# Patient Record
Sex: Female | Born: 1935 | Hispanic: No | Smoking: Never smoker
Health system: Southern US, Community
[De-identification: ages and names within clinical notes are randomized; demographics above are authoritative.]

## PROBLEM LIST (undated history)

## (undated) DIAGNOSIS — I10 Essential (primary) hypertension: Secondary | ICD-10-CM

---

## 2007-10-13 ENCOUNTER — Ambulatory Visit: Payer: Self-pay | Admitting: Internal Medicine

## 2008-11-16 ENCOUNTER — Ambulatory Visit: Payer: Self-pay | Admitting: Ophthalmology

## 2008-11-29 ENCOUNTER — Ambulatory Visit: Payer: Self-pay | Admitting: Ophthalmology

## 2010-03-09 ENCOUNTER — Ambulatory Visit: Payer: Self-pay | Admitting: Internal Medicine

## 2016-01-27 ENCOUNTER — Other Ambulatory Visit: Payer: Self-pay | Admitting: Internal Medicine

## 2016-01-27 ENCOUNTER — Ambulatory Visit
Admission: RE | Admit: 2016-01-27 | Discharge: 2016-01-27 | Disposition: A | Discharge status: Home / Self Care | Payer: Medicare Other | Source: Ambulatory Visit | Attending: Internal Medicine | Admitting: Internal Medicine

## 2016-01-27 DIAGNOSIS — R609 Edema, unspecified: Secondary | ICD-10-CM | POA: Insufficient documentation

## 2016-07-27 ENCOUNTER — Emergency Department: Payer: Medicare Other

## 2016-07-27 ENCOUNTER — Encounter: Payer: Self-pay | Admitting: Emergency Medicine

## 2016-07-27 ENCOUNTER — Observation Stay
Admission: EM | Admit: 2016-07-27 | Discharge: 2016-08-01 | Disposition: A | Discharge status: Skilled Nursing Facility | Payer: Medicare Other | Attending: Internal Medicine | Admitting: Internal Medicine

## 2016-07-27 DIAGNOSIS — R45851 Suicidal ideations: Secondary | ICD-10-CM

## 2016-07-27 DIAGNOSIS — R778 Other specified abnormalities of plasma proteins: Secondary | ICD-10-CM | POA: Diagnosis not present

## 2016-07-27 DIAGNOSIS — F03B18 Unspecified dementia, moderate, with other behavioral disturbance: Secondary | ICD-10-CM

## 2016-07-27 DIAGNOSIS — R531 Weakness: Secondary | ICD-10-CM | POA: Diagnosis not present

## 2016-07-27 DIAGNOSIS — F0391 Unspecified dementia with behavioral disturbance: Principal | ICD-10-CM | POA: Insufficient documentation

## 2016-07-27 DIAGNOSIS — N179 Acute kidney failure, unspecified: Secondary | ICD-10-CM | POA: Insufficient documentation

## 2016-07-27 DIAGNOSIS — D649 Anemia, unspecified: Secondary | ICD-10-CM | POA: Insufficient documentation

## 2016-07-27 DIAGNOSIS — R739 Hyperglycemia, unspecified: Secondary | ICD-10-CM | POA: Insufficient documentation

## 2016-07-27 DIAGNOSIS — I248 Other forms of acute ischemic heart disease: Secondary | ICD-10-CM | POA: Insufficient documentation

## 2016-07-27 DIAGNOSIS — R4182 Altered mental status, unspecified: Secondary | ICD-10-CM | POA: Diagnosis present

## 2016-07-27 DIAGNOSIS — I119 Hypertensive heart disease without heart failure: Secondary | ICD-10-CM | POA: Insufficient documentation

## 2016-07-27 DIAGNOSIS — R2681 Unsteadiness on feet: Secondary | ICD-10-CM

## 2016-07-27 HISTORY — DX: Essential (primary) hypertension: I10

## 2016-07-27 LAB — CBC
HEMATOCRIT: 39.7 % (ref 35.0–47.0)
Hemoglobin: 13 g/dL (ref 12.0–16.0)
MCH: 33.2 pg (ref 26.0–34.0)
MCHC: 32.8 g/dL (ref 32.0–36.0)
MCV: 101.1 fL — ABNORMAL HIGH (ref 80.0–100.0)
Platelets: 215 10*3/uL (ref 150–440)
RBC: 3.92 MIL/uL (ref 3.80–5.20)
RDW: 12.9 % (ref 11.5–14.5)
WBC: 8.7 10*3/uL (ref 3.6–11.0)

## 2016-07-27 LAB — COMPREHENSIVE METABOLIC PANEL
ALBUMIN: 3.7 g/dL (ref 3.5–5.0)
ALK PHOS: 86 U/L (ref 38–126)
ALT: 18 U/L (ref 14–54)
AST: 34 U/L (ref 15–41)
Anion gap: 9 (ref 5–15)
BUN: 19 mg/dL (ref 6–20)
CALCIUM: 9.1 mg/dL (ref 8.9–10.3)
CO2: 27 mmol/L (ref 22–32)
CREATININE: 1.19 mg/dL — AB (ref 0.44–1.00)
Chloride: 107 mmol/L (ref 101–111)
GFR calc Af Amer: 44 mL/min — ABNORMAL LOW (ref >60)
GFR calc non Af Amer: 38 mL/min — ABNORMAL LOW (ref >60)
GLUCOSE: 130 mg/dL — AB (ref 65–99)
Potassium: 4.1 mmol/L (ref 3.5–5.1)
SODIUM: 143 mmol/L (ref 135–145)
Total Bilirubin: 1 mg/dL (ref 0.3–1.2)
Total Protein: 7.8 g/dL (ref 6.5–8.1)

## 2016-07-27 LAB — SALICYLATE LEVEL

## 2016-07-27 LAB — ACETAMINOPHEN LEVEL: Acetaminophen (Tylenol), Serum: <10 ug/mL — ABNORMAL LOW (ref 10–30)

## 2016-07-27 LAB — TROPONIN I: TROPONIN I: 0.11 ng/mL — AB (ref <0.03)

## 2016-07-27 LAB — ETHANOL

## 2016-07-27 MED ORDER — SODIUM CHLORIDE 0.9 % IV SOLN
INTRAVENOUS | Status: DC
  Administered 2016-07-28: via INTRAVENOUS

## 2016-07-27 MED ORDER — ACETAMINOPHEN 650 MG RE SUPP: 650.0000 mg | Freq: Four times a day (QID) | RECTAL | Status: DC | PRN

## 2016-07-27 MED ORDER — ONDANSETRON HCL 4 MG PO TABS: 4.0000 mg | ORAL_TABLET | Freq: Four times a day (QID) | ORAL | Status: DC | PRN

## 2016-07-27 MED ORDER — ACETAMINOPHEN 325 MG PO TABS: 650.0000 mg | ORAL_TABLET | Freq: Four times a day (QID) | ORAL | Status: DC | PRN

## 2016-07-27 MED ORDER — ASPIRIN EC 81 MG PO TBEC
81.0000 mg | DELAYED_RELEASE_TABLET | Freq: Every day | ORAL | Status: DC
  Administered 2016-07-28 – 2016-08-01 (×4): 81 mg via ORAL
  Filled 2016-07-27 (×4): qty 1

## 2016-07-27 MED ORDER — BISACODYL 5 MG PO TBEC: 5.0000 mg | DELAYED_RELEASE_TABLET | Freq: Every day | ORAL | Status: DC | PRN

## 2016-07-27 MED ORDER — ASPIRIN 81 MG PO CHEW
324.0000 mg | CHEWABLE_TABLET | Freq: Once | ORAL | Status: AC
  Administered 2016-07-27: 324 mg via ORAL
  Filled 2016-07-27: qty 4

## 2016-07-27 MED ORDER — OXYCODONE HCL 5 MG PO TABS: 5.0000 mg | ORAL_TABLET | ORAL | Status: DC | PRN

## 2016-07-27 MED ORDER — ONDANSETRON HCL 4 MG/2ML IJ SOLN: 4.0000 mg | Freq: Four times a day (QID) | INTRAMUSCULAR | Status: DC | PRN

## 2016-07-27 MED ORDER — SENNOSIDES-DOCUSATE SODIUM 8.6-50 MG PO TABS: 1.0000 | ORAL_TABLET | Freq: Every evening | ORAL | Status: DC | PRN

## 2016-07-27 MED ORDER — ENOXAPARIN SODIUM 40 MG/0.4ML ~~LOC~~ SOLN
40.0000 mg | SUBCUTANEOUS | Status: DC
  Administered 2016-07-28: 40 mg via SUBCUTANEOUS
  Filled 2016-07-27: qty 0.4

## 2016-07-27 MED ORDER — SODIUM CHLORIDE 0.9% FLUSH
3.0000 mL | Freq: Two times a day (BID) | INTRAVENOUS | Status: DC
  Administered 2016-07-28 – 2016-07-31 (×4): 3 mL via INTRAVENOUS

## 2016-07-27 MED ORDER — MAGNESIUM CITRATE PO SOLN: 1.0000 | Freq: Once | ORAL | Status: DC | PRN

## 2016-07-27 NOTE — ED Notes (Signed)
Pt continues to deny pain. Pt repeatedly asking, "where is my car, where is my purse,why am I in jail". Multiple attempts made to reassure pt.

## 2016-07-27 NOTE — ED Notes (Signed)
CBC collected, R Forearm, sent to lab.

## 2016-07-27 NOTE — ED Notes (Signed)
Jan, ed tech at bedside for sitting and ivc.

## 2016-07-27 NOTE — BH Assessment (Signed)
Assessment Note  Allison Robinson is an 83 y.o. female, with undiagnosed dementia, presEmelda Brothersenting to the ED after being brought in by her family.  Per patient's  family she has been becoming more forgetful over the past several months. However, when the daughter visited earlier this week she found that the patient had wandered outside in the snow. She was threatening suicide to shoot herself with a gun. Patient has no memory of trying to shoot herself. The daughter then remove the gun from the house. The daughter also reports that the patient has been talking to people who were not there.  Patient is not oriented to place, time and situation.  She believes that she is in jail and being punished.  She has no memory of her daughter bringing her to the hospital or even talking with her daughter.  She believes that someone in the hospital has taken her pocketbook even being repeatedly told that her family has her belongings.    Diagnosis: Undiagnosed dementia  Past Medical History:  Past Medical History:  Diagnosis Date  . Hypertension     History reviewed. No pertinent surgical history.  Family History: No family history on file.  Social History:  reports that she has never smoked. Her smokeless tobacco use includes Snuff. She reports that she does not drink alcohol. Her drug history is not on file.  Additional Social History:  Alcohol / Drug Use Pain Medications: See PTA Prescriptions: See PTA Over the Counter: See PTA History of alcohol / drug use?: No history of alcohol / drug abuse  CIWA: CIWA-Ar BP: (!) 175/70 Pulse Rate: 94 COWS:    Allergies:  Allergies  Allergen Reactions  . Dairy Aid [Lactase]     Intolerance.    Home Medications:  (Not in a hospital admission)  OB/GYN Status:  No LMP recorded. Patient is postmenopausal.  General Assessment Data [facility] Assessment: In system Is this a Tele or Face-to-Face Assessment?: Face-to-Face Is this an  Initial Assessment or a Re-assessment for this encounter?: Initial Assessment Marital status: Single Maiden name: na Is patient pregnant?: No Pregnancy Status: No Living Arrangements: Children Can pt return to current living arrangement?: No (Pt's family is unable to care for her.) Admission Status: Involuntary Is patient capable of signing voluntary admission?: No Referral Source: Self/Family/Friend Insurance type: Medicare  Medical Screening Exam [facility]) Medical Exam completed: Yes  Crisis Care Plan Living Arrangements: Children Legal Guardian: Other: (self) Name of Psychiatrist: none reported Name of Therapist: none reported  Education Status Is patient currently in school?: No Current Grade: na Highest grade of school patient has completed: na Name of school: na Contact person: na  Risk to self with the past 6 months Suicidal Ideation: No Has patient been a risk to self within the past 6 months prior to admission? : No Suicidal Intent: No Has patient had any suicidal intent within the past 6 months prior to admission? : Yes Is patient at risk for suicide?: No Suicidal Plan?: No Has patient had any suicidal plan within the past 6 months prior to admission? : No Access to Means: No What has been your use of drugs/alcohol within the last 12 months?: None reported Previous Attempts/Gestures: No How many times?: 0 Other Self Harm Risks: altered mental status Triggers for Past Attempts: None known Intentional Self Injurious Behavior: None Family Suicide History: No Recent stressful life event(s): Other (Comment) Persecutory voices/beliefs?: No Depression: No Substance abuse history and/or treatment for  substance abuse?: No Suicide prevention information given to non-admitted patients: Not applicable  Risk to Others within the past 6 months Homicidal Ideation: No Does patient have any lifetime risk of violence toward others beyond the six months prior to  admission? : No Thoughts of Harm to Others: No Current Homicidal Intent: No Current Homicidal Plan: No Access to Homicidal Means: No Identified Victim: none identified History of harm to others?: No Assessment of Violence: None Noted Violent Behavior Description: None identified Does patient have access to weapons?: No Criminal Charges Pending?: No Does patient have a court date: No Is patient on probation?: No  Psychosis Hallucinations: None noted Delusions: Unspecified  Mental Status Report Appearance/Hygiene: Unremarkable Eye Contact: Good Motor Activity: Freedom of movement Speech: Argumentative Level of Consciousness: Alert Mood: Suspicious, Irritable Affect: Inconsistent with thought content Anxiety Level: None Thought Processes: Flight of Ideas Judgement: Partial Orientation: Person, Place Obsessive Compulsive Thoughts/Behaviors: None  Cognitive Functioning Concentration: Fair Memory: Remote Intact IQ: Average Insight: Unable to Assess Impulse Control: Unable to Assess Appetite: Fair Weight Loss: 0 Weight Gain: 0 Sleep: Unable to Assess Vegetative Symptoms: None  ADLScreening [facility] Assessment Services) Patient's cognitive ability adequate to safely complete daily activities?: Yes Patient able to express need for assistance with ADLs?: Yes Independently performs ADLs?: Yes (appropriate for developmental age)  Prior Inpatient Therapy Prior Inpatient Therapy: No Prior Therapy Dates: na Prior Therapy Facilty/Provider(s): na Reason for Treatment: na  Prior Outpatient Therapy Prior Outpatient Therapy: No Prior Therapy Dates: na Prior Therapy Facilty/Provider(s): na Reason for Treatment: na Does patient have an ACCT team?: No Does patient have Intensive In-House Services?  : No Does patient have [facility] services? : No Does patient have P4CC services?: No  ADL Screening (condition at time of admission) Patient's cognitive ability adequate to safely  complete daily activities?: Yes Patient able to express need for assistance with ADLs?: Yes Independently performs ADLs?: Yes (appropriate for developmental age)       Abuse/Neglect Assessment (Assessment to be complete while patient is alone) Physical Abuse: Denies Verbal Abuse: Denies Sexual Abuse: Denies Exploitation of patient/patient's resources: Denies Self-Neglect: Denies Values / Beliefs Cultural Requests During Hospitalization: None Spiritual Requests During Hospitalization: None Consults Spiritual Care Consult Needed: No Social Work Consult Needed: Yes (Comment) (Patient's family is requesting long term placement) Merchant navy officer (For Healthcare) Does Patient Have a Medical Advance Directive?: Yes Type of Advance Directive: Environmental manager of Healthcare Power of Attorney in Chart?: No - copy requested    Additional Information 1:1 In Past 12 Months?: No CIRT Risk: No Elopement Risk: No Does patient have medical clearance?: No     Disposition:  Disposition Initial Assessment Completed for this Encounter: Yes Disposition of Patient: Other dispositions Other disposition(s): Other (Comment) (Patient is being admitted to the medical floor.)  On Site Evaluation by:   Reviewed with Physician:    [place] 07/27/2016 10:12 PM

## 2016-07-27 NOTE — ED Notes (Signed)
Critical troponin of 0.11 called from lab. Pt to move back into room. Pt placed on cardiac monitor. Dr. Pershing Proudschaevitz notified of troponin of 0.11.

## 2016-07-27 NOTE — ED Notes (Signed)
Family states they would like pt admitted to hospital so they can seek placement in a nursing facility. Pt has dry lips, po fluids provided. Pt has dry oral mucus membranes. Pt is alert to self only. Pt placed in yellow socks, yellow arm band placed on pt. Pt placed in hospital gown and warm blankets provided.

## 2016-07-27 NOTE — ED Notes (Signed)
Pt refusing to remove beaded brown bracelet, bra or slip and shoes. Pt to ct head.

## 2016-07-27 NOTE — ED Notes (Addendum)
Pt's family states "she is getting real agitated, she need something." md has not assessed pt at this time. Family updated that md is requesting to see pt first. Family does not seem to understand ed treatment process regardless of repeated attempts by this rn to speak with family regarding same. Pt is sitting in bed with covers on and smiling at this rn, pt does not appear agiated at this time.

## 2016-07-27 NOTE — H&P (Signed)
History and Physical   [facility] [place] @ [facility] Admission History and Physical [employer], D.O.    Patient Name: Allison Robinson MR#: [id number] Date of Birth: [birth year] Date of Admission: 07/27/2016  Referring MD/NP/PA: Dr. Pershing Proud Primary Care Physician: No PCP Per Patient  Patient coming from: Home, lives independently  Chief Complaint: Altered mental status These note the entire history is obtained from the emergency department physician of the patient's chart as the patient had history is limited by dementia.   HPI: Allison Robinson is a 83 y.o. female with a known history of HTN presents to the emergency department for evaluation of AMS.  Patient reportedly lives independently however the patient's family brought her to the emergency department today for concern of altered mental status. Family reported to the emergency department staff that she has becoming more forgetful and confused over the past several months however recently there was an episode where the family member came to visit and found her walking outside in the snow, hallucinating. Today she was apparently threatening to shoot herself with a gun that was indeed in her home. Family members remove the gun from the house and brought her to the emergency department..   On questioning, the patient denies all complaints. She states that she feels fine. She believes she is being held in jail against her will.  Patient denies fevers/chills, weakness, dizziness, chest pain, shortness of breath, N/V/C/D, abdominal pain, dysuria/frequency, suicidal ideation/homicidal ideation.  Review of Systems:  Unable to obtain secondary to dementia, altered mental status   Past Medical History:  Diagnosis Date  . Hypertension     History reviewed. No pertinent surgical history.   reports that she has never smoked. Her smokeless tobacco use includes Snuff. She reports that she does not drink alcohol. Her drug history is  not on file.  Allergies  Allergen Reactions  . Dairy Aid [Lactase]     Intolerance.    No family history on file. Family history has been reviewed and confirmed with patient.   Prior to Admission medications   Not on File    Physical Exam: Vitals:   07/27/16 1936 07/27/16 2145 07/27/16 2200 07/27/16 2215  BP: (!) 160/66  (!) 175/70   Pulse: 90 94  (!) 101  Resp: 18 18 (!) 23 16  Temp:      TempSrc:      SpO2: 96% 99%  100%    GENERAL: 83 y.o.-year-old Black female patient, well-developed, well-nourished lying in the bed in no acute distress.  Pleasant and cooperative.  Pleasantly confused HEENT: Head atraumatic, normocephalic. Pupils equal, round, reactive to light and accommodation. No scleral icterus. Extraocular muscles intact. Nares are patent. Oropharynx is clear. Mucus membranes dry. NECK: Supple, full range of motion. No JVD, no bruit heard. No thyroid enlargement, no tenderness, no cervical lymphadenopathy. CHEST: Normal breath sounds bilaterally. No wheezing, rales, rhonchi or crackles. No use of accessory muscles of respiration.  No reproducible chest wall tenderness.  CARDIOVASCULAR: S1, S2 normal. No murmurs, rubs, or gallops. Cap refill <2 seconds. Pulses intact distally.  ABDOMEN: Soft, nondistended, nontender, . No rebound, guarding, rigidity. Normoactive bowel sounds present in all four quadrants. No organomegaly or mass. EXTREMITIES: No pedal edema, cyanosis, or clubbing. NEUROLOGIC: Cranial nerves II through XII are grossly intact with no focal sensorimotor deficit. Muscle strength 5/5 in all extremities. Sensation intact. Gait not checked. PSYCHIATRIC: The patient is alert and oriented x 1, person only. SKIN: Warm, dry, and intact without obvious  rash, lesion, or ulcer.   Labs on Admission: I have personally reviewed following labs and imaging studies  CBC:  Recent Labs Lab 07/27/16 1526  WBC 8.7  HGB 13.0  HCT 39.7  MCV 101.1*  PLT 215   Basic  Metabolic Panel:  Recent Labs Lab 07/27/16 1526  NA 143  K 4.1  CL 107  CO2 27  GLUCOSE 130*  BUN 19  CREATININE 1.19*  CALCIUM 9.1   GFR: CrCl cannot be calculated (Unknown ideal weight.). Liver Function Tests:  Recent Labs Lab 07/27/16 1526  AST 34  ALT 18  ALKPHOS 86  BILITOT 1.0  PROT 7.8  ALBUMIN 3.7   No results for input(s): LIPASE, AMYLASE in the last 168 hours. No results for input(s): AMMONIA in the last 168 hours. Coagulation Profile: No results for input(s): INR, PROTIME in the last 168 hours. Cardiac Enzymes:  Recent Labs Lab 07/27/16 1526  TROPONINI 0.11*   BNP (last 3 results) No results for input(s): PROBNP in the last 8760 hours. HbA1C: No results for input(s): HGBA1C in the last 72 hours. CBG: No results for input(s): GLUCAP in the last 168 hours. Lipid Profile: No results for input(s): CHOL, HDL, LDLCALC, TRIG, CHOLHDL, LDLDIRECT in the last 72 hours. Thyroid Function Tests: No results for input(s): TSH, T4TOTAL, FREET4, T3FREE, THYROIDAB in the last 72 hours. Anemia Panel: No results for input(s): VITAMINB12, FOLATE, FERRITIN, TIBC, IRON, RETICCTPCT in the last 72 hours. Urine analysis: No results found for: COLORURINE, APPEARANCEUR, LABSPEC, PHURINE, GLUCOSEU, HGBUR, BILIRUBINUR, KETONESUR, PROTEINUR, UROBILINOGEN, NITRITE, LEUKOCYTESUR Sepsis Labs: @LABRCNTIP (procalcitonin:4,lacticidven:4) )No results found for this or any previous visit (from the past 240 hour(s)).   Radiological Exams on Admission: Dg Chest 1 View  Result Date: 07/27/2016 CLINICAL DATA:  Altered mental status EXAM: CHEST 1 VIEW COMPARISON:  None. FINDINGS: Cardiomegaly with aortic atherosclerosis. Granuloma projects over the left posterior fifth rib. Lungs are free of pneumonic consolidations. No effusion or pneumothorax. There is no suspicious osseous abnormality. IMPRESSION: Cardiomegaly with aortic atherosclerosis. Left upper lobe granuloma. No acute pulmonary  disease. Electronically Signed   By: Tollie Eth M.D.   On: 07/27/2016 21:15   Ct Head Wo Contrast  Result Date: 07/27/2016 CLINICAL DATA:  Initial valuation for hallucination. EXAM: CT HEAD WITHOUT CONTRAST TECHNIQUE: Contiguous axial images were obtained from the base of the skull through the vertex without intravenous contrast. COMPARISON:  None available. FINDINGS: Brain: Generalized age-related cerebral atrophy with mild chronic small vessel ischemic disease. No acute intracranial hemorrhage. No evidence for acute large vessel territory infarct. No mass lesion, midline shift or mass effect. No hydrocephalus. No extra-axial fluid collection. Vascular: No hyperdense vessel. Scattered intracranial atherosclerosis noted. Skull: Scalp soft tissues demonstrate no acute abnormality. Hyperostosis frontalis interna. Calvarium intact. Sinuses/Orbits: Visualized globes and orbits within normal limits. Visualized paranasal sinuses and mastoids are clear. IMPRESSION: 1. No acute intracranial process. 2. Generalized age-related cerebral atrophy with mild chronic small vessel ischemic disease. Electronically Signed   By: Rise Mu M.D.   On: 07/27/2016 21:35    EKG: Normal sinus rhythm at 94 bpm with leftward axis, right bundle branch block and T-wave inversion in V2 and nonspecific ST-T wave changes.   Assessment/Plan Active Problems:   Altered mental status    This is a 83 y.o. female with a history of hypertension, undiagnosed dementia now being admitted with:  1. Altered mental status/suicidal ideation. Patient is a symptomatic and workup in the emergency department thus far has been unremarkable. This is  likely secondary to worsening of her dementia however patient is unsafe to live independently at present especially since she has been found hallucinating and reporting suicidal ideation. In the emergency department she was placed under involuntary commitment Therefore we will admit her to  inpatient for monitoring, psychiatric evaluation, physical therapy evaluation and social work consultation for consideration of placement as per family's wishes.  2. Elevated troponin, likely secondary to demand ischemia. Patient's EKG is nonischemic and she does not compare chest pain. We'll trend troponins, check lipids, TSH. Will give aspirin and start low-dose beta blocker for hypertension   Admission status: Inpatient, telemetry, involuntary commitment IV Fluids: Hep-Lock Diet/Nutrition: Heart healthy Consults called: PT and social work  DVT Px: Lovenox, SCDs and early ambulation Code Status: Full Code  Disposition Plan: To skilled nursing facility in 3-4 days   All the records are reviewed and case discussed with ED provider. Management plans discussed with the patient and/or family who express understanding and agree with plan of care.  Kynesha Guerin D.O. on 07/27/2016 at 10:50 PM Between 7am to 6pm - Pager - [phone] After 6pm go to [email or web address] - [email or web address] [email or web address] [facility] [place] [facility] Office [phone] CC: Primary care physician; No PCP Per Patient  Tonye RoyaltyAlexis Mathew Storck MD [facility] Pager [phone]   If 7PM-7AM, please contact night-coverage [email or web address] Password [email or web address]  07/27/2016, 10:50 PM

## 2016-07-27 NOTE — ED Notes (Signed)
md in to speak with family and pt.

## 2016-07-27 NOTE — ED Provider Notes (Signed)
[facility] Emergency Department Provider Note  ____________________________________________   First MD Initiated Contact with Patient 07/27/16 1948     (approximate)  I have reviewed the triage vital signs and the nursing notes.   HISTORY  Chief Complaint Altered Mental Status   HPI Allison Robinson is a 83 y.o. female with a history of hypertension and likely undiagnosed dementia who is presenting to the emergency department after being brought in by her family. She is accompanied by her daughter as well as granddaughter. Per the family she has been becoming more forgetful over the past several months. However, when the daughter visited earlier this week she found that the patient had wandered outside in the snow. She was threatening suicide to shoot herself with a gun. The daughter then remove the gun from the house. The daughter also reports that the patient has been talking to people who were not there.  Per the patient. She has no complaints and feels fine. However, she thinks it is the year [birth year].  Past Medical History:  Diagnosis Date  . Hypertension     There are no active problems to display for this patient.   History reviewed. No pertinent surgical history.  Prior to Admission medications   Not on File    Allergies Dairy aid [lactase]  No family history on file.  Social History Social History  Substance Use Topics  . Smoking status: Never Smoker  . Smokeless tobacco: Current User    Types: Snuff  . Alcohol use No    Review of Systems Constitutional: No fever/chills Eyes: No visual changes. ENT: No sore throat. Cardiovascular: Denies chest pain. Respiratory: Denies shortness of breath. Gastrointestinal: No abdominal pain.  No nausea, no vomiting.  No diarrhea.  No constipation. Genitourinary: Negative for dysuria. Musculoskeletal: Negative for back pain. Skin: Negative for rash. Neurological: Negative for headaches, focal  weakness or numbness.  10-point ROS otherwise negative.  ____________________________________________   PHYSICAL EXAM:  VITAL SIGNS: ED Triage Vitals [07/27/16 1515]  Enc Vitals Group     BP (!) 178/55     Pulse Rate 96     Resp 16     Temp 97.4 F (36.3 C)     Temp Source Oral     SpO2 99 %     Weight      Height      Head Circumference      Peak Flow      Pain Score      Pain Loc      Pain Edu?      Excl. in [place]?     Constitutional: Alert and To place, self and birthdate but not the year. Well appearing and in no acute distress. Eyes: Conjunctivae are normal. PERRL. EOMI. Head: Atraumatic. Nose: No congestion/rhinnorhea. Mouth/Throat: Mucous membranes are moist.   Neck: No stridor.   Cardiovascular: Normal rate, regular rhythm. Grossly normal heart sounds.  Respiratory: Normal respiratory effort.  No retractions. Lungs CTAB. Gastrointestinal: Soft and nontender. No distention.  Musculoskeletal: No lower extremity tenderness nor edema.   Neurologic:  Normal speech and language. No gross focal neurologic deficits are appreciated.  Skin:  Skin is warm, dry and intact. No rash noted. Psychiatric: Mood and affect are normal. Speech and behavior are normal.  ____________________________________________   LABS (all labs ordered are listed, but only abnormal results are displayed)  Labs Reviewed  COMPREHENSIVE METABOLIC PANEL - Abnormal; Notable for the following:       Result  Value   Glucose, Bld 130 (*)    Creatinine, Ser 1.19 (*)    GFR calc non Af Amer 38 (*)    GFR calc Af Amer 44 (*)    All other components within normal limits  CBC - Abnormal; Notable for the following:    MCV 101.1 (*)    All other components within normal limits  ACETAMINOPHEN LEVEL - Abnormal; Notable for the following:    Acetaminophen (Tylenol), Serum <10 (*)    All other components within normal limits  TROPONIN I - Abnormal; Notable for the following:    Troponin I 0.11 (*)     All other components within normal limits  SALICYLATE LEVEL  ETHANOL  URINALYSIS, COMPLETE (UACMP) WITH MICROSCOPIC  URINE DRUG SCREEN, QUALITATIVE ([facility] ONLY)   ____________________________________________  EKG  ED ECG REPORT I, Doran Stabler, the attending physician, personally viewed and interpreted this ECG.   Date: 07/27/2016  EKG Time: 2129  Rate: 94  Rhythm: normal sinus rhythm  Axis: Left axis deviation  Intervals: Right bundle branch block  ST&T Change: No ST segment elevation or depression. T-wave inversion in V2.  ____________________________________________  RADIOLOGY    Comprehensive metabolic panel (Final result)  Abnormal  Component (Lab Inquiry)  Collection Time Result Time NA K CL CO2 GLUCOSE  07/27/16 15:26:00 07/27/16 16:25:42 143 4.1 107 27 130 (H)    Collection Time Result Time BUN Creatinine, Ser CALCIUM PROTEIN Albumin  07/27/16 15:26:00 07/27/16 16:25:42 19 1.19 (H) 9.1 7.8 3.7    Collection Time Result Time AST ALT ALK PHOS BILI TOTL GFR calc non Af Amer  07/27/16 15:26:00 07/27/16 16:25:42 34 18 86 1.0 38 (L)    Collection Time Result Time GFR calc Af Amer Anion gap  07/27/16 15:26:00 07/27/16 16:25:42 (NOTE)  The eGFR has been calculated using the CKD EPI equation.  This calculation has not been validated in all clinical situations.  eGFR's persistently &lte;60 mL/min signify possible Chronic Kidney  Disease.   " data-bubble="IP_HOVER_BUBBLE_SERVICE">44 (L) (NOTE)  The eGFR has b... 9       CBC (Final result)  Abnormal  Component (Lab Inquiry)  Collection Time Result Time WBC RBC HGB HCT MCV  07/27/16 15:26:00 07/27/16 16:12:39 8.7 3.92 13.0 39.7 101.1 (H)    Collection Time Result Time MCH MCHC RDW PLT  07/27/16 15:26:00 07/27/16 16:12:39 33.2 32.8 12.9 215      Acetaminophen level (Final result)  Abnormal  Component (Lab Inquiry)  Collection Time Result Time Acetaminophen (Tylenol), Serum  07/27/16  15:26:00 07/27/16 21:09:44   THERAPEUTIC CONCENTRATIONS VARY  SIGNIFICANTLY. A RANGE OF 10-30  ug/mL MAY BE AN EFFECTIVE  CONCENTRATION FOR MANY PATIENTS.  HOWEVER, SOME ARE BEST TREATED  AT CONCENTRATIONS OUTSIDE THIS  RANGE.  ACETAMINOPHEN CONCENTRATIONS  &gte;150 ug/mL AT 4 HOURS AFTER  INGESTION AND &gte;50 ug/mL AT 12  HOURS AFTER INGESTION ARE  OFTEN ASSOCIATED WITH TOXIC  REACTIONS.   ' data-bubble="IP_HOVER_BUBBLE_SERVICE"><10 (L)  THERAPEUTIC CO...       Salicylate level (Final result)  Component (Lab Inquiry)  Collection Time Result Time Salicylate Lvl  82/70/78 15:26:00 07/27/16 21:09:44 <7.0      Ethanol (Final result)  Component (Lab Inquiry)  Collection Time Result Time Alcohol, Ethyl (B)  07/27/16 15:26:00 07/27/16 21:09:44   LOWEST DETECTABLE LIMIT FOR  SERUM ALCOHOL IS 5 mg/dL  FOR MEDICAL PURPOSES ONLY   ' data-bubble="IP_HOVER_BUBBLE_SERVICE"><5  LOWEST DETECTA...       Troponin I (Final result)  Abnormal  Component (Lab Inquiry)  Collection Time Result Time TROPONIN I  07/27/16 15:26:00 07/27/16 21:18:11 CRITICAL RESULT CALLED TO, READ BACK BY AND VERIFIED WITH  APRIL BRUMGARD 07/27/16 @ 2120 [place]   ' data-bubble="IP_HOVER_BUBBLE_SERVICE">0.11 ([place]) CRITICAL RESULT CALLED.Marland KitchenMarland Kitchen     Imaging Results     CT Head Wo Contrast (In process)     DG Chest 1 View (Final result)  Result time 07/27/16 21:15:31  Final result by Massie Kluver, MD (07/27/16 21:15:31)           Narrative:   CLINICAL DATA: Altered mental status  EXAM: CHEST 1 VIEW  COMPARISON: None.  FINDINGS: Cardiomegaly with aortic atherosclerosis. Granuloma projects over the left posterior fifth rib. Lungs are free of pneumonic consolidations. No effusion or pneumothorax. There is no suspicious osseous abnormality.  IMPRESSION: Cardiomegaly with aortic atherosclerosis. Left upper lobe granuloma. No acute pulmonary disease.   Electronically Signed By:  Ashley Royalty M.D. On: 07/27/2016 21:15            ____________________________________________   PROCEDURES  Procedure(s) performed:   Procedures  Critical Care performed:   ____________________________________________   INITIAL IMPRESSION / ASSESSMENT AND PLAN / ED COURSE  Pertinent labs & imaging results that were available during my care of the patient were reviewed by me and considered in my medical decision making (see chart for details).   Clinical Course   ----------------------------------------- 9:35 PM on 07/27/2016 -----------------------------------------  Patient placed under involuntary commitment. Also found to have elevated troponin of 0.11 of unknown significance. Will be admitted medically to the hospital. At the advice of the intake specialist the tele-psychiatry consult to change to a face-to-face consult. Family updated about the need for medical admission.   ____________________________________________   FINAL CLINICAL IMPRESSION(S) / ED DIAGNOSES  Altered mental status. Suicidal ideation.    NEW MEDICATIONS STARTED DURING THIS VISIT:  New Prescriptions   No medications on file     Note:  This document was prepared using Dragon voice recognition software and may include unintentional dictation errors.    Orbie Pyo, MD 07/27/16 2136

## 2016-07-27 NOTE — ED Notes (Signed)
Bri, cna is now sitting with pt.

## 2016-07-27 NOTE — ED Notes (Addendum)
Beth, ed tech at bedside to sit with pt at this time. Pt sipping on po fluids.

## 2016-07-27 NOTE — ED Triage Notes (Signed)
Patient to ED via POV with her dtr and grand-dtr, family states that patient has been hallucinating and has been wondering outside of her house. Grand daughter states that patient needs a higher level of care than they are able to provide for patient. Family wants patient admitted for placement. Family has concerns of dehydration and UTI, family states that patients urine has been very dark.

## 2016-07-27 NOTE — ED Notes (Addendum)
Pt has brown cane, yellow metal watch and glasses. Pt previously up to commode to obtain urine sample without success, pt urinated around toilet and not in hat.

## 2016-07-27 NOTE — ED Notes (Signed)
Pt to radiology.

## 2016-07-27 NOTE — BHH Counselor (Signed)
Per Dr. Pershing ProudSchaevitz, patient will be admitted to the hospital for medical admission.

## 2016-07-28 LAB — CBC
HCT: 33.3 % — ABNORMAL LOW (ref 35.0–47.0)
Hemoglobin: 11.3 g/dL — ABNORMAL LOW (ref 12.0–16.0)
MCH: 33.5 pg (ref 26.0–34.0)
MCHC: 33.9 g/dL (ref 32.0–36.0)
MCV: 99.1 fL (ref 80.0–100.0)
PLATELETS: 179 10*3/uL (ref 150–440)
RBC: 3.36 MIL/uL — AB (ref 3.80–5.20)
RDW: 12.9 % (ref 11.5–14.5)
WBC: 7 10*3/uL (ref 3.6–11.0)

## 2016-07-28 LAB — BASIC METABOLIC PANEL
Anion gap: 5 (ref 5–15)
BUN: 12 mg/dL (ref 6–20)
CALCIUM: 8.7 mg/dL — AB (ref 8.9–10.3)
CHLORIDE: 111 mmol/L (ref 101–111)
CO2: 26 mmol/L (ref 22–32)
CREATININE: 0.77 mg/dL (ref 0.44–1.00)
GFR calc non Af Amer: >60 mL/min (ref >60)
GLUCOSE: 89 mg/dL (ref 65–99)
Potassium: 3.5 mmol/L (ref 3.5–5.1)
Sodium: 142 mmol/L (ref 135–145)

## 2016-07-28 LAB — LIPID PANEL
CHOL/HDL RATIO: 2.4 ratio
CHOLESTEROL: 122 mg/dL (ref 0–200)
HDL: 50 mg/dL (ref >40)
LDL CALC: 61 mg/dL (ref 0–99)
TRIGLYCERIDES: 53 mg/dL (ref <150)
VLDL: 11 mg/dL (ref 0–40)

## 2016-07-28 LAB — TROPONIN I
TROPONIN I: 0.14 ng/mL — AB (ref <0.03)
Troponin I: 0.13 ng/mL (ref <0.03)
Troponin I: 0.1 ng/mL (ref <0.03)

## 2016-07-28 LAB — TSH: TSH: 2.405 u[IU]/mL (ref 0.350–4.500)

## 2016-07-28 MED ORDER — ENOXAPARIN SODIUM 30 MG/0.3ML ~~LOC~~ SOLN: 30.0000 mg | Freq: Every day | SUBCUTANEOUS | Status: DC

## 2016-07-28 MED ORDER — HALOPERIDOL LACTATE 5 MG/ML IJ SOLN
INTRAMUSCULAR | Status: AC
  Administered 2016-07-28: 1 mg via INTRAVENOUS
  Filled 2016-07-28: qty 1

## 2016-07-28 MED ORDER — ENOXAPARIN SODIUM 40 MG/0.4ML ~~LOC~~ SOLN
40.0000 mg | Freq: Every day | SUBCUTANEOUS | Status: DC
  Administered 2016-07-28 – 2016-07-31 (×3): 40 mg via SUBCUTANEOUS
  Filled 2016-07-28 (×4): qty 0.4

## 2016-07-28 MED ORDER — HALOPERIDOL LACTATE 5 MG/ML IJ SOLN
1.0000 mg | Freq: Four times a day (QID) | INTRAMUSCULAR | Status: DC | PRN
  Administered 2016-07-28 – 2016-07-30 (×2): 1 mg via INTRAVENOUS
  Filled 2016-07-28: qty 1

## 2016-07-28 MED ORDER — LORAZEPAM 2 MG/ML IJ SOLN
1.0000 mg | Freq: Once | INTRAMUSCULAR | Status: AC
  Administered 2016-07-28: 1 mg via INTRAVENOUS
  Filled 2016-07-28: qty 1

## 2016-07-28 MED ORDER — METOPROLOL SUCCINATE ER 25 MG PO TB24
12.5000 mg | ORAL_TABLET | Freq: Every day | ORAL | Status: DC
  Administered 2016-07-28 – 2016-08-01 (×4): 12.5 mg via ORAL
  Filled 2016-07-28 (×4): qty 1

## 2016-07-28 NOTE — Progress Notes (Signed)
Pharmacy Note - Anticoagulation  Patient with orders for enoxaparin 30mg  SQ Q24H for VTE prophylaxis  Estimated Creatinine Clearance: 37.9 mL/min (by C-G formula based on SCr of 0.77 mg/dL).  Increased to enoxaparin 40mg  SQ Q24H for CrCl > 5930ml/min and TBW>45kg  Garlon HatchetJody Klaus Casteneda, PharmD, BCPS Clinical Pharmacist  07/28/2016 2:32 PM

## 2016-07-28 NOTE — Progress Notes (Signed)
New Admit  Arrival Method: From ED Mental Orientation: Oriented to self, Disoriented to time, place, and situation. Confused. Forgetful. Telemetry: yes 40-31, NSR Assessment: Pt repeats the same few questions over and over. Forgetful. Becomes combative and agitated  at times related to confusion. Pt believes she is in jail after constant reassurance. Denies suicidal thoughts. Lungs are clear, S1 and S2 auscultated and regular. Skin: Dry and flaky. Sacral dressing initiated. Darker Skin on ankles and feet. Iv: 20 G left wrist Pain: Denies Safety Measures: Bed alarm on, sitter at bedside, yellow socks on, limit setting, decrease stimulation. Admission: Incomplete. Pt is poor historian and cannot focus to help with admission. 1A Orientation: Complete

## 2016-07-28 NOTE — Care Management CC44 (Signed)
Condition Code 44 Documentation Completed  Patient Details  Name: Allison Robinson MRN: [id number] Date of Birth: Jun 25, 1919   Condition Code 44 given:  Yes Patient signature on Condition Code 44 notice:   (Patient unable to comprehend. Daughter Allison Robinson was contacted and updated.) Documentation of 2 MD's agreement:  Yes Code 44 added to claim:  Yes    Henning Ehle A, RN 07/28/2016, 4:33 PM

## 2016-07-28 NOTE — Care Management Obs Status (Signed)
MEDICARE OBSERVATION STATUS NOTIFICATION   Patient Details  Name: Allison Robinson MRN: [id number] Date of Birth: 13-Mar-1919   Medicare Observation Status Notification Given:  Yes, given to daughter [name].    Dior Stepter A, RN 07/28/2016, 4:33 PM

## 2016-07-28 NOTE — Progress Notes (Signed)
[facility] - [place] at [facility]   PATIENT NAME: Allison Robinson    MR#:  [id number]  DATE OF BIRTH:  07-26-1918  SUBJECTIVE:  CHIEF COMPLAINT:   Chief Complaint  Patient presents with  . Altered Mental Status  The patient is 83 year old African-American female with past medical history of hypertension, dementia who presents to the hospital with complaints of altered mental status. Apparently patient became a more forgetful and confused over the past few months. On the day of admission. She was threatening to shoot herself with a gun, which was in her home, patient's family removed again and brought her to emergency room for further evaluation. She denies and discomfort, feels good.  Review of Systems  Constitutional: Negative for chills, fever and weight loss.  HENT: Negative for congestion.   Eyes: Negative for blurred vision and double vision.  Respiratory: Negative for cough, sputum production, shortness of breath and wheezing.   Cardiovascular: Negative for chest pain, palpitations, orthopnea, leg swelling and PND.  Gastrointestinal: Negative for abdominal pain, blood in stool, constipation, diarrhea, nausea and vomiting.  Genitourinary: Negative for dysuria, frequency, hematuria and urgency.  Musculoskeletal: Negative for falls.  Neurological: Negative for dizziness, tremors, focal weakness and headaches.  Endo/Heme/Allergies: Does not bruise/bleed easily.  Psychiatric/Behavioral: Negative for depression. The patient does not have insomnia.     VITAL SIGNS: Blood pressure 120/65, pulse 90, temperature 98.7 F (37.1 C), temperature source Axillary, resp. rate 16, height 5\' 5"  (1.651 m), weight 60.1 kg (132 lb 6.4 oz), SpO2 100 %.  PHYSICAL EXAMINATION:   GENERAL:  83 y.o.-year-old patient lying in the bed with no acute distress.  EYES: Pupils equal, round, reactive to light and accommodation. No scleral icterus. Extraocular muscles intact.  HEENT: Head  atraumatic, normocephalic. Oropharynx and nasopharynx clear.  NECK:  Supple, no jugular venous distention. No thyroid enlargement, no tenderness.  LUNGS: Normal breath sounds bilaterally, no wheezing, rales,rhonchi or crepitation. No use of accessory muscles of respiration.  CARDIOVASCULAR: S1, S2 normal. No murmurs, rubs, or gallops.  ABDOMEN: Soft, nontender, nondistended. Bowel sounds present. No organomegaly or mass.  EXTREMITIES: No pedal edema, cyanosis, or clubbing.  NEUROLOGIC: Cranial nerves II through XII are intact. Muscle strength 5/5 in all extremities. Sensation intact. Gait not checked.  PSYCHIATRIC: The patient is alert and oriented x 2.  SKIN: No obvious rash, lesion, or ulcer.   ORDERS/RESULTS REVIEWED:   CBC  Recent Labs Lab 07/27/16 1526 07/28/16 0556  WBC 8.7 7.0  HGB 13.0 11.3*  HCT 39.7 33.3*  PLT 215 179  MCV 101.1* 99.1  MCH 33.2 33.5  MCHC 32.8 33.9  RDW 12.9 12.9   ------------------------------------------------------------------------------------------------------------------  Chemistries   Recent Labs Lab 07/27/16 1526 07/28/16 0556  NA 143 142  K 4.1 3.5  CL 107 111  CO2 27 26  GLUCOSE 130* 89  BUN 19 12  CREATININE 1.19* 0.77  CALCIUM 9.1 8.7*  AST 34  --   ALT 18  --   ALKPHOS 86  --   BILITOT 1.0  --    ------------------------------------------------------------------------------------------------------------------ estimated creatinine clearance is 37.9 mL/min (by C-G formula based on SCr of 0.77 mg/dL). ------------------------------------------------------------------------------------------------------------------  Recent Labs  07/28/16 0009  TSH 2.405    Cardiac Enzymes  Recent Labs Lab 07/28/16 0009 07/28/16 0556 07/28/16 1233  TROPONINI 0.14* 0.13* 0.10*   ------------------------------------------------------------------------------------------------------------------ Invalid input(s):  POCBNP ---------------------------------------------------------------------------------------------------------------  RADIOLOGY: Dg Chest 1 View  Result Date: 07/27/2016 CLINICAL DATA:  Altered mental status EXAM: CHEST  1 VIEW COMPARISON:  None. FINDINGS: Cardiomegaly with aortic atherosclerosis. Granuloma projects over the left posterior fifth rib. Lungs are free of pneumonic consolidations. No effusion or pneumothorax. There is no suspicious osseous abnormality. IMPRESSION: Cardiomegaly with aortic atherosclerosis. Left upper lobe granuloma. No acute pulmonary disease. Electronically Signed   By: Tollie Ethavid  Kwon M.D.   On: 07/27/2016 21:15   Ct Head Wo Contrast  Result Date: 07/27/2016 CLINICAL DATA:  Initial valuation for hallucination. EXAM: CT HEAD WITHOUT CONTRAST TECHNIQUE: Contiguous axial images were obtained from the base of the skull through the vertex without intravenous contrast. COMPARISON:  None available. FINDINGS: Brain: Generalized age-related cerebral atrophy with mild chronic small vessel ischemic disease. No acute intracranial hemorrhage. No evidence for acute large vessel territory infarct. No mass lesion, midline shift or mass effect. No hydrocephalus. No extra-axial fluid collection. Vascular: No hyperdense vessel. Scattered intracranial atherosclerosis noted. Skull: Scalp soft tissues demonstrate no acute abnormality. Hyperostosis frontalis interna. Calvarium intact. Sinuses/Orbits: Visualized globes and orbits within normal limits. Visualized paranasal sinuses and mastoids are clear. IMPRESSION: 1. No acute intracranial process. 2. Generalized age-related cerebral atrophy with mild chronic small vessel ischemic disease. Electronically Signed   By: Rise MuBenjamin  McClintock M.D.   On: 07/27/2016 21:35    EKG:  Orders placed or performed during the hospital encounter of 07/27/16  . ED EKG  . ED EKG  . EKG 12-Lead    ASSESSMENT AND PLAN:  Active Problems:   Altered mental  status  #1. Dementia with behavioral disturbance, psychiatric consultation is obtained, pending, patient has a sister, may need to be placed, discussed with care management, getting physical therapist evaluation #2. Acute renal insufficiency, resolved with IV fluid administration, oral intake is good, discontinue IV fluids, get urinalysis and culture if needed #3. Elevated troponin, likely demand ischemia, getting echocardiogram, and cardiology consultation if needed #4. Anemia, get Hemoccult #5. Hyperglycemia, normal fasting blood glucose level, no diabetes  Management plans discussed with the patient, family and they are in agreement.   DRUG ALLERGIES:  Allergies  Allergen Reactions  . Dairy Aid [Lactase]     Intolerance.    CODE STATUS:     Code Status Orders        Start     Ordered   07/28/16 0000  Full code  Continuous     07/27/16 2359    Code Status History    Date Active Date Inactive Code Status Order ID Comments User Context   This patient has a current code status but no historical code status.    Advance Directive Documentation   Flowsheet Row Most Recent Value  Type of Advance Directive  Healthcare Power of Attorney  Pre-existing out of facility DNR order (yellow form or pink MOST form)  No data  "MOST" Form in Place?  No data      TOTAL TIME TAKING CARE OF THIS PATIENT: 40 minutes.    Katharina CaperVAICKUTE,Vontae Court M.D on 07/28/2016 at 3:26 PM  Between 7am to 6pm - Pager - [phone]  After 6pm go to [email or web address] - password [email or web address] [facility]  [facility]  Office  [phone]  CC: Primary care physician; No PCP Per Patient

## 2016-07-28 NOTE — ED Notes (Signed)
Sitter remains at bedside. Pt continues to deny pain.

## 2016-07-28 NOTE — ED Notes (Signed)
Repeat troponin of 0.14 resulted from lab. This rn notified dr. Emmit PomfretHugelmeyer. No new orders received. Pt continues to deny pain.

## 2016-07-28 NOTE — Progress Notes (Signed)
Pharmacist - Prescriber Communication  Enoxaparin dose has been changed to 30 mg subcutaneously once daily due to creatinine clearance less than 30 mL/min.  Carrson Lightcap A. Rosstonookson, VermontPharm.D., BCPS Clinical Pharmacist 07/29/2015 [phone]

## 2016-07-28 NOTE — Clinical Social Work Note (Signed)
CSW received consult for SNF placement. CSW will follow pending PT recommendations or family request to assist with ALF placement if desired.   Argentina PonderKaren Martha Tyshawna Alarid, MSW, LCSW-A [phone]

## 2016-07-28 NOTE — Evaluation (Signed)
Physical Therapy Evaluation Patient Details Name: Allison Robinson MRN: [id number] DOB: 13-Jan-1919 Today's Date: 07/28/2016   History of Present Illness  Pt is a 83 y/o F who present with AMS.  Apparently patient became a more forgetful and confused over the past few months. On the day of admission. She was threatening to shoot herself with a gun, which was in her home. Pt's PMH includes dementia.     Clinical Impression  Pt admitted with above diagnosis. Pt currently with functional limitations due to the deficits listed below (see PT Problem List). Ms. Savard reports she was Ind using SPC in her home where she lives alone PTA and denies any falls in the past 6 months.  No family present to confirm information.  She currently requires mod assist for sit<>stand transfers and min assist for ambulation using SPC due to instability and is at an increased risk of falling.  Recommending SNF at d/c.  Pt will benefit from skilled PT to increase their independence and safety with mobility to allow discharge to the venue listed below.      Follow Up Recommendations SNF;Supervision/Assistance - 24 hour    Equipment Recommendations  Rolling walker with 5" wheels    Recommendations for Other Services       Precautions / Restrictions Precautions Precautions: Fall Restrictions Weight Bearing Restrictions: No      Mobility  Bed Mobility Overal bed mobility: Needs Assistance Bed Mobility: Supine to Sit     Supine to sit: Min guard;HOB elevated     General bed mobility comments: Increased time and effort with HOB elevated and use of bed rail  Transfers Overall transfer level: Needs assistance Equipment used: Straight cane Transfers: Sit to/from Stand Sit to Stand: Mod assist         General transfer comment: Mod assist to steady as pt demonstrates posterior bias and braces LEs against side of bed to stand.  Cues for anterior weight shift which improves minimally.  Stood from bed x1 and  from standard heigh commode x1.  Ambulation/Gait Ambulation/Gait assistance: Min assist Ambulation Distance (Feet): 30 Feet Assistive device: Straight cane Gait Pattern/deviations: Step-through pattern;Decreased stride length;Staggering left;Staggering right;Trunk flexed Gait velocity: decreased Gait velocity interpretation: <1.8 ft/sec, indicative of risk for recurrent falls General Gait Details: Pt holding cane but not using it despite cues for technique.  Flexed posture and pt reaching out for counter top, end of bed, etc.  Pt staggers L and R due to instability requiring assist to steady.  Stairs            Wheelchair Mobility    Modified Rankin (Stroke Patients Only)       Balance Overall balance assessment: Needs assistance Sitting-balance support: No upper extremity supported;Feet supported Sitting balance-Leahy Scale: Good     Standing balance support: No upper extremity supported;During functional activity Standing balance-Leahy Scale: Poor Standing balance comment: Pt unsteady without UE support                             Pertinent Vitals/Pain Pain Assessment: Faces Faces Pain Scale: Hurts little more Pain Location: posterior thighs (pt pointing to this region) Pain Descriptors / Indicators: Discomfort;Grimacing Pain Intervention(s): Limited activity within patient's tolerance;Monitored during session;Repositioned    Home Living Family/patient expects to be discharged to:: Skilled nursing facility Living Arrangements: Alone Available Help at Discharge: Family;Available PRN/intermittently Type of Home: House Home Access: Stairs to enter Entrance Stairs-Rails: Left Entrance Stairs-Number of  Steps: 12 Home Layout: One level Home Equipment: Cane - single point Additional Comments: Information provided by pt, no family present to confirm.    Prior Function Level of Independence: Independent with assistive device(s)         Comments: Pt  denies any falls in the past 6 months.  Says she ambulates with her SPC.  No family present to confirm information.     Hand Dominance        Extremity/Trunk Assessment   Upper Extremity Assessment Upper Extremity Assessment: Overall WFL for tasks assessed (Strength grossly 4/5 BUE)    Lower Extremity Assessment Lower Extremity Assessment:  (Strength grossly 4/5 BLE)    Cervical / Trunk Assessment Cervical / Trunk Assessment: Kyphotic  Communication   Communication: No difficulties  Cognition Arousal/Alertness: Awake/alert Behavior During Therapy: WFL for tasks assessed/performed Overall Cognitive Status: History of cognitive impairments - at baseline                      General Comments General comments (skin integrity, edema, etc.): Pt saying, "I feel like I'm starting my period".  Unsure if this is related to pain.  Informed pt that she is post menopause.  RN notified.    Exercises     Assessment/Plan    PT Assessment Patient needs continued PT services  PT Problem List Decreased strength;Decreased balance;Decreased knowledge of use of DME;Decreased safety awareness;Pain          PT Treatment Interventions DME instruction;Gait training;Stair training;Functional mobility training;Therapeutic activities;Therapeutic exercise;Balance training;Neuromuscular re-education;Patient/family education;Cognitive remediation    PT Goals (Current goals can be found in the Care Plan section)  Acute Rehab PT Goals Patient Stated Goal: none stated PT Goal Formulation: With patient Time For Goal Achievement: 08/11/16 Potential to Achieve Goals: Good    Frequency Min 2X/week   Barriers to discharge Inaccessible home environment;Decreased caregiver support Steps to enter home and pt lives alone    Co-evaluation               End of Session Equipment Utilized During Treatment: Gait belt Activity Tolerance: Patient tolerated treatment well Patient left: in  chair;with call bell/phone within reach;with chair alarm set;with nursing/sitter in room (RN in room throughout entire session.) Nurse Communication: Mobility status    Functional Assessment Tool Used: Clinical Judgement Functional Limitation: Mobility: Walking and moving around Mobility: Walking and Moving Around Current Status [phone](G8978): At least 40 percent but less than 60 percent impaired, limited or restricted Mobility: Walking and Moving Around Goal Status [phone](G8979): At least 20 percent but less than 40 percent impaired, limited or restricted    Time: [id number] PT Time Calculation (min) (ACUTE ONLY): 19 min   Charges:   PT Evaluation $PT Eval Moderate Complexity: 1 Procedure PT Treatments $Gait Training: 8-22 mins   PT G Codes:   PT G-Codes **NOT FOR INPATIENT CLASS** Functional Assessment Tool Used: Clinical Judgement Functional Limitation: Mobility: Walking and moving around Mobility: Walking and Moving Around Current Status ([id number](G8978): At least 40 percent but less than 60 percent impaired, limited or restricted Mobility: Walking and Moving Around Goal Status [phone](G8979): At least 20 percent but less than 40 percent impaired, limited or restricted    Encarnacion ChuAshley Markesha Hannig PT, DPT 07/28/2016, 4:19 PM

## 2016-07-29 ENCOUNTER — Observation Stay
Admit: 2016-07-29 | Discharge: 2016-07-29 | Disposition: A | Discharge status: Home / Self Care | Payer: Medicare Other | Attending: Internal Medicine | Admitting: Internal Medicine

## 2016-07-29 LAB — URINE DRUG SCREEN, QUALITATIVE (ARMC ONLY)
Amphetamines, Ur Screen: NOT DETECTED
BARBITURATES, UR SCREEN: NOT DETECTED
Benzodiazepine, Ur Scrn: NOT DETECTED
CANNABINOID 50 NG, UR ~~LOC~~: NOT DETECTED
COCAINE METABOLITE, UR ~~LOC~~: NOT DETECTED
MDMA (ECSTASY) UR SCREEN: NOT DETECTED
METHADONE SCREEN, URINE: NOT DETECTED
OPIATE, UR SCREEN: NOT DETECTED
Phencyclidine (PCP) Ur S: NOT DETECTED
Tricyclic, Ur Screen: NOT DETECTED

## 2016-07-29 LAB — URINALYSIS, COMPLETE (UACMP) WITH MICROSCOPIC
Bacteria, UA: NONE SEEN
Bilirubin Urine: NEGATIVE
Glucose, UA: NEGATIVE mg/dL
HGB URINE DIPSTICK: NEGATIVE
Ketones, ur: NEGATIVE mg/dL
Leukocytes, UA: NEGATIVE
NITRITE: NEGATIVE
PH: 7 (ref 5.0–8.0)
Protein, ur: NEGATIVE mg/dL
SPECIFIC GRAVITY, URINE: 1.011 (ref 1.005–1.030)

## 2016-07-29 NOTE — Progress Notes (Signed)
[facility] [place] at [facility]   PATIENT NAME: Allison Robinson    MR#:  [id number]  DATE OF BIRTH:  1918-10-29  SUBJECTIVE:  CHIEF COMPLAINT:   Chief Complaint  Patient presents with  . Altered Mental Status  The patient is 83 year old African-American female with past medical history of hypertension, dementia who presents to the hospital with complaints of altered mental status. Apparently patient became a more forgetful and confused over the past few months. On the day of admission. She was threatening to shoot herself with a gun, which was in her home, patient's family removed again and brought her to emergency room for further evaluation. The patient was agitated yesterday, initiated on Haldol as needed. She is asleep. Today in the morning, denies any discomfort. Patient's family is interested in placing her, social worker's consulted for bed search, discussed with social worker today.   Review of Systems  Constitutional: Negative for chills, fever and weight loss.  HENT: Negative for congestion.   Eyes: Negative for blurred vision and double vision.  Respiratory: Negative for cough, sputum production, shortness of breath and wheezing.   Cardiovascular: Negative for chest pain, palpitations, orthopnea, leg swelling and PND.  Gastrointestinal: Negative for abdominal pain, blood in stool, constipation, diarrhea, nausea and vomiting.  Genitourinary: Negative for dysuria, frequency, hematuria and urgency.  Musculoskeletal: Negative for falls.  Neurological: Negative for dizziness, tremors, focal weakness and headaches.  Endo/Heme/Allergies: Does not bruise/bleed easily.  Psychiatric/Behavioral: Negative for depression. The patient does not have insomnia.     VITAL SIGNS: Blood pressure (!) 148/66, pulse 78, temperature 98.2 F (36.8 C), temperature source Oral, resp. rate 18, height 5\' 5"  (1.651 m), weight 60.1 kg (132 lb 6.4 oz), SpO2 96 %.  PHYSICAL  EXAMINATION:   GENERAL:  83 y.o.-year-old patient lying in the bed with no acute distress.  EYES: Pupils equal, round, reactive to light and accommodation. No scleral icterus. Extraocular muscles intact.  HEENT: Head atraumatic, normocephalic. Oropharynx and nasopharynx clear.  NECK:  Supple, no jugular venous distention. No thyroid enlargement, no tenderness.  LUNGS: Normal breath sounds bilaterally, no wheezing, rales,rhonchi or crepitation. No use of accessory muscles of respiration.  CARDIOVASCULAR: S1, S2 normal. No murmurs, rubs, or gallops.  ABDOMEN: Soft, nontender, nondistended. Bowel sounds present. No organomegaly or mass.  EXTREMITIES: No pedal edema, cyanosis, or clubbing.  NEUROLOGIC: Cranial nerves II through XII are intact. Muscle strength 5/5 in all extremities. Sensation intact. Gait not checked.  PSYCHIATRIC: The patient is alert and oriented x 2.  SKIN: No obvious rash, lesion, or ulcer.   ORDERS/RESULTS REVIEWED:   CBC  Recent Labs Lab 07/27/16 1526 07/28/16 0556  WBC 8.7 7.0  HGB 13.0 11.3*  HCT 39.7 33.3*  PLT 215 179  MCV 101.1* 99.1  MCH 33.2 33.5  MCHC 32.8 33.9  RDW 12.9 12.9   ------------------------------------------------------------------------------------------------------------------  Chemistries   Recent Labs Lab 07/27/16 1526 07/28/16 0556  NA 143 142  K 4.1 3.5  CL 107 111  CO2 27 26  GLUCOSE 130* 89  BUN 19 12  CREATININE 1.19* 0.77  CALCIUM 9.1 8.7*  AST 34  --   ALT 18  --   ALKPHOS 86  --   BILITOT 1.0  --    ------------------------------------------------------------------------------------------------------------------ estimated creatinine clearance is 37.9 mL/min (by C-G formula based on SCr of 0.77 mg/dL). ------------------------------------------------------------------------------------------------------------------  Recent Labs  07/28/16 0009  TSH 2.405    Cardiac Enzymes  Recent Labs Lab 07/28/16 0009  07/28/16 0556 07/28/16 1233  TROPONINI 0.14* 0.13* 0.10*   ------------------------------------------------------------------------------------------------------------------ Invalid input(s): POCBNP ---------------------------------------------------------------------------------------------------------------  RADIOLOGY: Dg Chest 1 View  Result Date: 07/27/2016 CLINICAL DATA:  Altered mental status EXAM: CHEST 1 VIEW COMPARISON:  None. FINDINGS: Cardiomegaly with aortic atherosclerosis. Granuloma projects over the left posterior fifth rib. Lungs are free of pneumonic consolidations. No effusion or pneumothorax. There is no suspicious osseous abnormality. IMPRESSION: Cardiomegaly with aortic atherosclerosis. Left upper lobe granuloma. No acute pulmonary disease. Electronically Signed   By: Tollie Ethavid  Kwon M.D.   On: 07/27/2016 21:15   Ct Head Wo Contrast  Result Date: 07/27/2016 CLINICAL DATA:  Initial valuation for hallucination. EXAM: CT HEAD WITHOUT CONTRAST TECHNIQUE: Contiguous axial images were obtained from the base of the skull through the vertex without intravenous contrast. COMPARISON:  None available. FINDINGS: Brain: Generalized age-related cerebral atrophy with mild chronic small vessel ischemic disease. No acute intracranial hemorrhage. No evidence for acute large vessel territory infarct. No mass lesion, midline shift or mass effect. No hydrocephalus. No extra-axial fluid collection. Vascular: No hyperdense vessel. Scattered intracranial atherosclerosis noted. Skull: Scalp soft tissues demonstrate no acute abnormality. Hyperostosis frontalis interna. Calvarium intact. Sinuses/Orbits: Visualized globes and orbits within normal limits. Visualized paranasal sinuses and mastoids are clear. IMPRESSION: 1. No acute intracranial process. 2. Generalized age-related cerebral atrophy with mild chronic small vessel ischemic disease. Electronically Signed   By: Rise MuBenjamin  McClintock M.D.   On: 07/27/2016  21:35    EKG:  Orders placed or performed during the hospital encounter of 07/27/16  . ED EKG  . ED EKG  . EKG 12-Lead  . EKG 12-Lead  . EKG 12-Lead    ASSESSMENT AND PLAN:  Active Problems:   Altered mental status  #1. Dementia with behavioral disturbance, psychiatric consultation is pending, Continue Haldol as needed, EKG was repeated, QT interval is about 450 ms, needs to be placed, discussed with care management today, appreciate physical therapist evaluation, skilled nursing facility was recommended #2. Acute renal insufficiency, resolved with IV fluid administration, oral intake is good, now off IV fluids, urinalysis was unremarkable  #3. Elevated troponin, likely demand ischemia, echocardiogram is pending, getting cardiology consultation if needed #4. Anemia, get Hemoccult #5. Hyperglycemia, normal fasting blood glucose level, no diabetes #6. Generalized weakness, skilled nursing facility placement was recommended, social worker is involved Management plans discussed with the patient, family and they are in agreement.   DRUG ALLERGIES:  Allergies  Allergen Reactions  . Dairy Aid [Lactase]     Intolerance.    CODE STATUS:     Code Status Orders        Start     Ordered   07/28/16 0000  Full code  Continuous     07/27/16 2359    Code Status History    Date Active Date Inactive Code Status Order ID Comments User Context   This patient has a current code status but no historical code status.    Advance Directive Documentation   Flowsheet Row Most Recent Value  Type of Advance Directive  Healthcare Power of Attorney  Pre-existing out of facility DNR order (yellow form or pink MOST form)  No data  "MOST" Form in Place?  No data      TOTAL TIME TAKING CARE OF THIS PATIENT: 30 minutes.    Katharina CaperVAICKUTE,Peter Keyworth M.D on 07/29/2016 at 1:46 PM  Between 7am to 6pm - Pager - [phone]  After 6pm go to [email or web address] - password [email or web address]  [facility]   Office  [phone]  CC: Primary care physician; No PCP Per Patient

## 2016-07-29 NOTE — Progress Notes (Signed)
Patient is alert to self only. IVC, safety sitter in room. Up with SBA. LBM 1/6. Patient had no issues with behavior this shift. Bed alarm on for safety.

## 2016-07-29 NOTE — BH Assessment (Signed)
Writer informed psychiatrist (Dr. Subedi) of consult. 

## 2016-07-29 NOTE — Progress Notes (Signed)
Nutrition Brief Note  Patient identified on the Malnutrition Screening Tool (MST) Report  Wt Readings from Last 15 Encounters:  07/28/16 132 lb 6.4 oz (60.1 kg)    Body mass index is 22.03 kg/m. Patient meets criteria for normal based on current BMI.   Current diet order is Heart Healthy, patient is consuming approximately 100% of meals at this time. Labs and medications reviewed.   No nutrition interventions warranted at this time. If nutrition issues arise, please consult RD.   Dionne AnoWilliam M. Lianna Sitzmann, MS, RD LDN Inpatient Clinical Dietitian Pager [phone]

## 2016-07-29 NOTE — NC FL2 (Signed)
[place] MEDICAID FL2 LEVEL OF CARE SCREENING TOOL     IDENTIFICATION  Patient Name: Allison Robinson Birthdate: [birth year] Sex: female Admission Date (Current Location): 07/27/2016  [place]   Facility and Address:  [facility], [address], [place], [place] [address]      Provider Number: [id number]  Attending Physician Name and Address:  Katharina Caperima Vaickute, MD  Relative Name and Phone Number:       Current Level of Care: Hospital Recommended Level of Care: Skilled Nursing Facility Prior Approval Number:    Date Approved/Denied: 07/29/16 PASRR Number: [phone]  Discharge Plan: SNF    Current Diagnoses: Patient Active Problem List   Diagnosis Date Noted  . Altered mental status 07/27/2016    Orientation RESPIRATION BLADDER Height & Weight     Self, Time  Normal Continent Weight: 132 lb 6.4 oz (60.1 kg) Height:  5\' 5"  (165.1 cm)  BEHAVIORAL SYMPTOMS/MOOD NEUROLOGICAL BOWEL NUTRITION STATUS      Continent    AMBULATORY STATUS COMMUNICATION OF NEEDS Skin   Extensive Assist Verbally Normal                       Personal Care Assistance Level of Assistance  Bathing, Dressing Bathing Assistance: Limited assistance   Dressing Assistance: Limited assistance     Functional Limitations Info             SPECIAL CARE FACTORS FREQUENCY  PT (By licensed PT)     PT Frequency: Up to 5X per day, 5 days per week              Contractures Contractures Info: Present    Additional Factors Info  Allergies   Allergies Info: Lactase           Current Medications (07/29/2016):  This is the current hospital active medication list Current Facility-Administered Medications  Medication Dose Route Frequency Provider Last Rate Last Dose  . acetaminophen (TYLENOL) tablet 650 mg  650 mg Oral Q6H PRN Alexis Hugelmeyer, DO       Or  . acetaminophen (TYLENOL) suppository 650 mg  650 mg Rectal Q6H PRN Alexis  Hugelmeyer, DO      . aspirin EC tablet 81 mg  81 mg Oral Daily Alexis Hugelmeyer, DO   81 mg at 07/28/16 1010  . bisacodyl (DULCOLAX) EC tablet 5 mg  5 mg Oral Daily PRN Alexis Hugelmeyer, DO      . enoxaparin (LOVENOX) injection 40 mg  40 mg Subcutaneous QHS Katharina Caperima Vaickute, MD   40 mg at 07/28/16 2141  . haloperidol lactate (HALDOL) injection 1 mg  1 mg Intravenous Q6H PRN Katharina Caperima Vaickute, MD   1 mg at 07/28/16 1645  . magnesium citrate solution 1 Bottle  1 Bottle Oral Once PRN Alexis Hugelmeyer, DO      . metoprolol succinate (TOPROL-XL) 24 hr tablet 12.5 mg  12.5 mg Oral Daily Alexis Hugelmeyer, DO   12.5 mg at 07/28/16 1010  . ondansetron (ZOFRAN) tablet 4 mg  4 mg Oral Q6H PRN Alexis Hugelmeyer, DO       Or  . ondansetron (ZOFRAN) injection 4 mg  4 mg Intravenous Q6H PRN Alexis Hugelmeyer, DO      . oxyCODONE (Oxy IR/ROXICODONE) immediate release tablet 5 mg  5 mg Oral Q4H PRN Alexis Hugelmeyer, DO      . senna-docusate (Senokot-S) tablet 1 tablet  1 tablet Oral QHS PRN Alexis Hugelmeyer, DO      .  sodium chloride flush (NS) 0.9 % injection 3 mL  3 mL Intravenous Q12H Alexis Hugelmeyer, DO   3 mL at 07/28/16 0008     Discharge Medications: Please see discharge summary for a list of discharge medications.  Relevant Imaging Results:  Relevant Lab Results:   Additional Information SS# [id number]  Allison Cong, LCSW

## 2016-07-29 NOTE — Clinical Social Work Note (Signed)
Clinical Social Work Assessment  Patient Details  Name: Allison Robinson MRN: [id number] Date of Birth: 03/07/1919  Date of referral:  07/29/16               Reason for consult:  Discharge Planning, Facility Placement                Permission sought to share information with:  Chartered certified accountant granted to share information::  Yes, Verbal Permission Granted  Name::        Agency::     Relationship::     Contact Information:     Housing/Transportation Living arrangements for the past 2 months:  Single Family Home Source of Information:  Adult Children Patient Interpreter Needed:  None Criminal Activity/Legal Involvement Pertinent to Current Situation/Hospitalization:  No - Comment as needed Significant Relationships:  Adult Children, Other Family Members Lives with:  Self Do you feel safe going back to the place where you live?  Yes Need for family participation in patient care:  Yes (Comment)  Care giving concerns:  STR   Social Worker assessment / plan:  CSW met with the patient's daughter and granddaughter to discuss dc planning. The patient's family is concerned due to escalating s/s of dementia, including brandishing a gun. The family gave verbal permission to conduct a SNF bed search, and the CSW explained that placement at SNF level may be difficult due to dementia. The CSW explained the differences between ALF and SNF, and why PT is recommending SNF. The family members verbalized understanding, and reported that they would like her to go to SNF during which time the accepting facility can assist with further placement needs.  At baseline, the patient is ambulatory, continent, and can dress/bathe/feed. The family manages her medications and finances.  Employment status:  Retired Nurse, adult PT Recommendations:  [place] / Referral to community resources:  [place]  Patient/Family's Response to care:  Family thanked CSW for assistance.  Patient/Family's Understanding of and Emotional Response to Diagnosis, Current Treatment, and Prognosis:  Family understands that level of care is necessary for the patient and are in agreement.  Emotional Assessment Appearance:  Appears stated age Attitude/Demeanor/Rapport:  Avoidant, Guarded, Apprehensive, Reactive, Paranoid Affect (typically observed):  Agitated Orientation:  Oriented to Self Alcohol / Substance use:  Never Used Psych involvement (Current and /or in the community):  Yes (Comment)  Discharge Needs  Concerns to be addressed:  Cognitive Concerns, Discharge Planning Concerns Readmission within the last 30 days:  No Current discharge risk:  Cognitively Impaired Barriers to Discharge:  Continued Medical Work up   [employer], LCSW 07/29/2016, 5:21 PM

## 2016-07-30 LAB — ECHOCARDIOGRAM COMPLETE
HEIGHTINCHES: 65 in
Weight: 2118.4 oz

## 2016-07-30 MED ORDER — LISINOPRIL 5 MG PO TABS
2.5000 mg | ORAL_TABLET | Freq: Every day | ORAL | Status: DC
  Administered 2016-07-30 – 2016-07-31 (×2): 2.5 mg via ORAL
  Filled 2016-07-30 (×2): qty 1

## 2016-07-30 NOTE — Progress Notes (Signed)
[facility] - [place] at [facility]   PATIENT NAME: Allison Robinson    MR#:  [id number]  DATE OF BIRTH:  [birth year]  SUBJECTIVE:  CHIEF COMPLAINT:   Chief Complaint  Patient presents with  . Altered Mental Status  The patient is 83 year old African-American female with past medical history of hypertension, dementia who presents to the hospital with complaints of altered mental status. Apparently patient became a more forgetful and confused over the past few months. On the day of admission. She was threatening to shoot herself with a gun, which was in her home, patient's family removed again and brought her to emergency room for further evaluation. The patient was agitated yesterday, initiated on Haldol as needed. She is asleep. Today in the morning, denies any discomfort. Patient's family is interested in placing her, social worker's consulted for bed search, discussed with social worker today. Patient remains on IVC, has sitter 24/ 7. She denies and discomfort. Psychiatrist consultation is still pending  Review of Systems  Constitutional: Negative for chills, fever and weight loss.  HENT: Negative for congestion.   Eyes: Negative for blurred vision and double vision.  Respiratory: Negative for cough, sputum production, shortness of breath and wheezing.   Cardiovascular: Negative for chest pain, palpitations, orthopnea, leg swelling and PND.  Gastrointestinal: Negative for abdominal pain, blood in stool, constipation, diarrhea, nausea and vomiting.  Genitourinary: Negative for dysuria, frequency, hematuria and urgency.  Musculoskeletal: Negative for falls.  Neurological: Negative for dizziness, tremors, focal weakness and headaches.  Endo/Heme/Allergies: Does not bruise/bleed easily.  Psychiatric/Behavioral: Negative for depression. The patient does not have insomnia.     VITAL SIGNS: Blood pressure (!) 156/62, pulse 72, temperature 97.8 F (36.6 C), temperature  source Oral, resp. rate 18, height 5\' 5"  (1.651 m), weight 60.1 kg (132 lb 6.4 oz), SpO2 100 %.  PHYSICAL EXAMINATION:   GENERAL:  83 y.o.-year-old patient lying in the bed with no acute distress.  EYES: Pupils equal, round, reactive to light and accommodation. No scleral icterus. Extraocular muscles intact.  HEENT: Head atraumatic, normocephalic. Oropharynx and nasopharynx clear.  NECK:  Supple, no jugular venous distention. No thyroid enlargement, no tenderness.  LUNGS: Normal breath sounds bilaterally, no wheezing, rales,rhonchi or crepitation. No use of accessory muscles of respiration.  CARDIOVASCULAR: S1, S2 normal. No murmurs, rubs, or gallops.  ABDOMEN: Soft, nontender, nondistended. Bowel sounds present. No organomegaly or mass.  EXTREMITIES: No pedal edema, cyanosis, or clubbing.  NEUROLOGIC: Cranial nerves II through XII are intact. Muscle strength 5/5 in all extremities. Sensation intact. Gait not checked.  PSYCHIATRIC: The patient is alert and oriented x 2.  SKIN: No obvious rash, lesion, or ulcer.   ORDERS/RESULTS REVIEWED:   CBC  Recent Labs Lab 07/27/16 1526 07/28/16 0556  WBC 8.7 7.0  HGB 13.0 11.3*  HCT 39.7 33.3*  PLT 215 179  MCV 101.1* 99.1  MCH 33.2 33.5  MCHC 32.8 33.9  RDW 12.9 12.9   ------------------------------------------------------------------------------------------------------------------  Chemistries   Recent Labs Lab 07/27/16 1526 07/28/16 0556  NA 143 142  K 4.1 3.5  CL 107 111  CO2 27 26  GLUCOSE 130* 89  BUN 19 12  CREATININE 1.19* 0.77  CALCIUM 9.1 8.7*  AST 34  --   ALT 18  --   ALKPHOS 86  --   BILITOT 1.0  --    ------------------------------------------------------------------------------------------------------------------ estimated creatinine clearance is 37.9 mL/min (by C-G formula based on SCr of 0.77  mg/dL). ------------------------------------------------------------------------------------------------------------------  Recent Labs  07/28/16 0009  TSH 2.405    Cardiac Enzymes  Recent Labs Lab 07/28/16 0009 07/28/16 0556 07/28/16 1233  TROPONINI 0.14* 0.13* 0.10*   ------------------------------------------------------------------------------------------------------------------ Invalid input(s): POCBNP ---------------------------------------------------------------------------------------------------------------  RADIOLOGY: No results found.  EKG:  Orders placed or performed during the hospital encounter of 07/27/16  . ED EKG  . ED EKG  . EKG 12-Lead  . EKG 12-Lead  . EKG 12-Lead    ASSESSMENT AND PLAN:  Active Problems:   Altered mental status  #1. Dementia with behavioral disturbance, psychiatric consultation is pending, Continue Haldol as needed, EKG was repeated, QT interval is about 450 ms, Patient remains on IVC, has sitter 24/7, discussed with care management today, appreciate physical therapist evaluation, skilled nursing facility was recommended #2. Acute renal insufficiency, resolved with IV fluid administration, oral intake is relatively good, now off IV fluids, urinalysis was unremarkable  #3. Elevated troponin, likely demand ischemia, echocardiogram revealed normal ejection fraction, diastolic dysfunction, moderate aortic and tricuspid regurgitation, mild mitral regurgitation, initiate lisinopril  #4. Anemia, Hemoccult is spending  #5. Hyperglycemia, normal fasting blood glucose level, no diabetes #6. Generalized weakness, skilled nursing facility placement was recommended, social worker is involved #7. Essential hypertension, initiate patient on low dose of lisinopril, advance it as needed Management plans discussed with the patient, family and they are in agreement.   DRUG ALLERGIES:  Allergies  Allergen Reactions  . Dairy Aid [Lactase]      Intolerance.    CODE STATUS:     Code Status Orders        Start     Ordered   07/28/16 0000  Full code  Continuous     07/27/16 2359    Code Status History    Date Active Date Inactive Code Status Order ID Comments User Context   This patient has a current code status but no historical code status.    Advance Directive Documentation   Flowsheet Row Most Recent Value  Type of Advance Directive  Healthcare Power of Attorney  Pre-existing out of facility DNR order (yellow form or pink MOST form)  No data  "MOST" Form in Place?  No data      TOTAL TIME TAKING CARE OF THIS PATIENT: 20 minutes.    Katharina CaperVAICKUTE,Mateya Torti M.D on 07/30/2016 at 1:46 PM  Between 7am to 6pm - Pager - [phone]  After 6pm go to [email or web address] - password [email or web address] [facility]  [facility]  Office  [phone]  CC: Primary care physician; No PCP Per Patient

## 2016-07-30 NOTE — Progress Notes (Signed)
Clinical Education officer, museum (CSW) met with patient's daughter Agustina Caroli to discuss D/C plan. Per daughter she toured [facility] in [place] today and spoke with the admissions coordinator and would like for patient to go there. CSW contacted [facility] in [facility] and per admissions coordinator they will have to assess patient in person before they can make a bed offer and she is not sure when they can send someone out.   CSW made patient's daughter aware of above and explained that patient will have to go to a facility that has made a bed offer if patient is ready for discharge before [facility] can send someone out to assess. CSW made daughter aware that only bed offer at this point is [facility]. Daughter requested referral to be sent to [facility] in [place]. Referral was sent and a voicemail was left for [facility] admissions coordinator at [facility].  Patient has to be 24 hours sitter free before going to SNF. MD aware of above. CSW will continue to follow and assist as needed.   [employer], LCSW [phone]

## 2016-07-30 NOTE — Clinical Social Work Placement (Signed)
   CLINICAL SOCIAL WORK PLACEMENT  NOTE  Date:  07/30/2016  Patient Details  Name: Allison Robinson Mae Lema MRN: [id number] Date of Birth: 02/11/1919  Clinical Social Work is seeking post-discharge placement for this patient at the Skilled  Nursing Facility level of care (*CSW will initial, date and re-position this form in  chart as items are completed):  Yes   Patient/family provided with [facility] Clinical Social Work Department's list of facilities offering this level of care within the geographic area requested by the patient (or if unable, by the patient's family).  Yes   Patient/family informed of their freedom to choose among providers that offer the needed level of care, that participate in Medicare, Medicaid or managed care program needed by the patient, have an available bed and are willing to accept the patient.  Yes   Patient/family informed of [facility]'s ownership interest [facility] and [facility], as well as of the fact that they are under no obligation to receive care at these facilities.  PASRR submitted to EDS on 07/29/16     PASRR number received on 07/29/16     Existing PASRR number confirmed on       FL2 transmitted to all facilities in geographic area requested by pt/family on 07/29/16     FL2 transmitted to all facilities within larger geographic area on 07/30/16     Patient informed that his/her managed care company has contracts with or will negotiate with certain facilities, including the following:        Yes   Patient/family informed of bed offers received.  Patient chooses bed at       Physician recommends and patient chooses bed at      Patient to be transferred to   on  .  Patient to be transferred to facility by       Patient family notified on   of transfer.  Name of family member notified:        PHYSICIAN       Additional Comment:    _______________________________________________ Lovenia Debruler, Darleen CrockerBailey M, LCSW 07/30/2016, 4:47  PM

## 2016-07-30 NOTE — Care Management (Signed)
Can proceed with SNF placement once IVC and Sitter has been discontinued. Sitter must be off for 24 hours prior to SNF acceptance.

## 2016-07-31 DIAGNOSIS — F0391 Unspecified dementia with behavioral disturbance: Secondary | ICD-10-CM | POA: Diagnosis not present

## 2016-07-31 DIAGNOSIS — F03B18 Unspecified dementia, moderate, with other behavioral disturbance: Secondary | ICD-10-CM

## 2016-07-31 MED ORDER — LISINOPRIL 5 MG PO TABS
5.0000 mg | ORAL_TABLET | Freq: Every day | ORAL | Status: DC
Start: 2016-08-01 — End: 2016-08-01
  Administered 2016-08-01: 5 mg via ORAL
  Filled 2016-07-31: qty 1

## 2016-07-31 NOTE — Consult Note (Signed)
[facility]   Reason for Consult:  Consult for this 83 year old woman brought into the hospital by her family with altered mental status Referring Physician:  Winona LegatoVaickute Patient Identification: Allison Robinson Allison Ditmars MRN:  [id number] Principal Diagnosis: Moderate dementia with behavioral disturbance Diagnosis:   Patient Active Problem List   Diagnosis Date Noted  . Moderate dementia with behavioral disturbance [F03.91] 07/31/2016  . Altered mental status [R41.82] 07/27/2016    Total Time spent with patient: 1 hour  Subjective:   Allison Emelda BrothersMae Robinson is a 83 y.o. female patient admitted with "I don't know".  HPI:  This is a 83 year old woman was brought to the hospital over the weekend with altered mental status. It was reported that family had gone to visit her and found her outside wandering around in the snow and appearing to be delirious. Also they reported that she had made statements about shooting herself and actually did have a gun at home. On interview today the patient is awake and alert and cooperative. She has no specific complaint of her own. Did not know where she was at first although when I pointed out some clues to her she was able to figure out that it was a hospital room. She had no memory of being brought into the hospital. Patient denied being depressed. Denied being aware of sleep or appetite problems. Interestingly, when I talk with her about her suicidal statement she readily admitted having made statements about killing herself. She told me she thinks that everyone says that now and then. She denied having any actual wish to die or hurt herself. She told me that she would rather just let the Lord take his way. Patient denied having any hallucinations. She was not acting delirious and did not appear to be hallucinating. Rarely demented however.  Social history: Evidently had been living independently until just the last couple days. It sounds like she has a very  supportive extended family.  Medical history: Patient has high blood pressure but otherwise sounds like she doesn't get much medical treatment and hasn't had a lot of documented medical care. Her [place] substance abuse history: Denies ever having had an alcohol problem in the past or drinking currently.  Past Psychiatric History: No known past psychiatric history. No history of suicide attempts no history of hospitalization no known history of psychiatric medication.  Risk to Self: Suicidal Ideation: No Suicidal Intent: No Is patient at risk for suicide?: No Suicidal Plan?: No Access to Means: No What has been your use of drugs/alcohol within the last 12 months?: None reported How many times?: 0 Other Self Harm Risks: altered mental status Triggers for Past Attempts: None known Intentional Self Injurious Behavior: None Risk to Others: Homicidal Ideation: No Thoughts of Harm to Others: No Current Homicidal Intent: No Current Homicidal Plan: No Access to Homicidal Means: No Identified Victim: none identified History of harm to others?: No Assessment of Violence: None Noted Violent Behavior Description: None identified Does patient have access to weapons?: No Criminal Charges Pending?: No Does patient have a court date: No Prior Inpatient Therapy: Prior Inpatient Therapy: No Prior Therapy Dates: na Prior Therapy Facilty/Provider(s): na Reason for Treatment: na Prior Outpatient Therapy: Prior Outpatient Therapy: No Prior Therapy Dates: na Prior Therapy Facilty/Provider(s): na Reason for Treatment: na Does patient have an ACCT team?: No Does patient have Intensive In-House Services?  : No Does patient have [facility] services? : No Does patient have P4CC services?: No  Past Medical History:  Past  Medical History:  Diagnosis Date  . Hypertension    History reviewed. No pertinent surgical history. Family History: No family history on file. Family Psychiatric  History: No known  family history Social History:  History  Alcohol Use No     History  Drug use: Unknown    Social History   Social History  . Marital status: Widowed    Spouse name: N/A  . Number of children: N/A  . Years of education: N/A   Social History Main Topics  . Smoking status: Never Smoker  . Smokeless tobacco: Current User    Types: Snuff  . Alcohol use No  . Drug use: Unknown  . Sexual activity: Not Asked   Other Topics Concern  . None   Social History Narrative  . None   Additional Social History:    Allergies:   Allergies  Allergen Reactions  . Dairy Aid [Lactase]     Intolerance.    Labs: No results found for this or any previous visit (from the past 48 hour(s)).  Current Facility-Administered Medications  Medication Dose Route Frequency Provider Last Rate Last Dose  . acetaminophen (TYLENOL) tablet 650 mg  650 mg Oral Q6H PRN Alexis Hugelmeyer, DO       Or  . acetaminophen (TYLENOL) suppository 650 mg  650 mg Rectal Q6H PRN Alexis Hugelmeyer, DO      . aspirin EC tablet 81 mg  81 mg Oral Daily Alexis Hugelmeyer, DO   81 mg at 07/31/16 1033  . bisacodyl (DULCOLAX) EC tablet 5 mg  5 mg Oral Daily PRN Alexis Hugelmeyer, DO      . enoxaparin (LOVENOX) injection 40 mg  40 mg Subcutaneous QHS Katharina Caper, MD   40 mg at 07/30/16 2116  . haloperidol lactate (HALDOL) injection 1 mg  1 mg Intravenous Q6H PRN Katharina Caper, MD   1 mg at 07/30/16 1417  . [START ON 08/01/2016] lisinopril (PRINIVIL,ZESTRIL) tablet 5 mg  5 mg Oral Daily Katharina Caper, MD      . magnesium citrate solution 1 Bottle  1 Bottle Oral Once PRN Alexis Hugelmeyer, DO      . metoprolol succinate (TOPROL-XL) 24 hr tablet 12.5 mg  12.5 mg Oral Daily Alexis Hugelmeyer, DO   12.5 mg at 07/31/16 1032  . ondansetron (ZOFRAN) tablet 4 mg  4 mg Oral Q6H PRN Alexis Hugelmeyer, DO       Or  . ondansetron (ZOFRAN) injection 4 mg  4 mg Intravenous Q6H PRN Alexis Hugelmeyer, DO      . oxyCODONE (Oxy IR/ROXICODONE)  immediate release tablet 5 mg  5 mg Oral Q4H PRN Alexis Hugelmeyer, DO      . senna-docusate (Senokot-S) tablet 1 tablet  1 tablet Oral QHS PRN Alexis Hugelmeyer, DO      . sodium chloride flush (NS) 0.9 % injection 3 mL  3 mL Intravenous Q12H Alexis Hugelmeyer, DO   3 mL at 07/31/16 1033    Musculoskeletal: Strength & Muscle Tone: decreased Gait & Station: unable to stand Patient leans: N/A  Psychiatric Specialty Exam: Physical Exam  Nursing note and vitals reviewed. Constitutional: She appears well-developed.  HENT:  Head: Normocephalic and atraumatic.  Eyes: Conjunctivae are normal. Pupils are equal, round, and reactive to light.  Neck: Normal range of motion.  Cardiovascular: Regular rhythm and normal heart sounds.   Respiratory: Effort normal. No respiratory distress.  GI: Soft.  Musculoskeletal: Normal range of motion.  Neurological: She is alert.  Skin: Skin is  warm and dry.  Psychiatric: Her affect is blunt. Her speech is delayed. She is slowed. Thought content is not paranoid. Cognition and memory are impaired. She expresses no homicidal and no suicidal ideation. She exhibits abnormal recent memory and abnormal remote memory. She is inattentive.    Review of Systems  Constitutional: Negative.   HENT: Negative.   Eyes: Negative.   Respiratory: Negative.   Cardiovascular: Negative.   Gastrointestinal: Negative.   Musculoskeletal: Negative.   Skin: Negative.   Neurological: Negative.   Psychiatric/Behavioral: Positive for memory loss. Negative for depression, hallucinations, substance abuse and suicidal ideas. The patient is not nervous/anxious and does not have insomnia.     Blood pressure (!) 137/51, pulse 68, temperature 98.1 F (36.7 C), temperature source Oral, resp. rate 18, height 5\' 5"  (1.651 m), weight 60.1 kg (132 lb 6.4 oz), SpO2 98 %.Body mass index is 22.03 kg/m.  General Appearance: Casual  Eye Contact:  Fair  Speech:  Slow  Volume:  Decreased  Mood:   Euthymic  Affect:  Constricted  Thought Process:  Disorganized and Irrelevant  Orientation:  Negative  Thought Content:  Tangential  Suicidal Thoughts:  Yes.  without intent/plan  Homicidal Thoughts:  No  Memory:  Immediate;   Fair Recent;   Poor Remote;   Poor  Judgement:  Impaired  Insight:  Shallow  Psychomotor Activity:  Decreased  Concentration:  Concentration: Poor  Recall:  Poor  Fund of Knowledge:  Fair  Language:  Fair  Akathisia:  No  Handed:  Right  AIMS (if indicated):     Assets:  Housing Social Support  ADL's:  Impaired  Cognition:  Impaired,  Mild and Moderate  Sleep:        Treatment Plan Summary: Plan This is a 83 year old woman brought into the hospital after being found confused outside. She was admitted to the medical service because of one elevated troponin but there was no ultimate diagnosis of a specific event. Medically she appears to otherwise be stable. On interview today I found the patient to be pleasant and cooperative but very clearly demented. Her great-grandson (an adult) was in the room during the interview. She had no idea who he was. He introduced himself and talk with her for several minutes. After about 10 minutes she once again had no idea who he was. She was disoriented as to her the situation. Couldn't describe anything about recent events that have been happening. I don't think patient has enough functioning memory capacity to have the ability to make reasonable decisions and I don't think it is safe for her to continue living independently. Strongly recommend that some sort of placement be discovered but there is no indication for specific psychiatric medicine at this point. I will follow-up as needed. I have discontinue the involuntary commitment and sitter.  Disposition: Patient does not meet criteria for psychiatric inpatient admission. Supportive therapy provided about ongoing stressors.  Mordecai Rasmussen, MD 07/31/2016 7:53 PM

## 2016-07-31 NOTE — Progress Notes (Signed)
[facility] -  at [facility]   PATIENT NAME: Allison Robinson    MR#:  [id number]  DATE OF BIRTH:  May 19, 1919  SUBJECTIVE:  CHIEF COMPLAINT:   Chief Complaint  Patient presents with  . Altered Mental Status  The patient is 83 year old African-American female with past medical history of hypertension, dementia who presents to the hospital with complaints of altered mental status. Apparently patient became a more forgetful and confused over the past few months. On the day of admission. She was threatening to shoot herself with a gun, which was in her home, patient's family removed again and brought her to emergency room for further evaluation. The patient was agitated yesterday, initiated on Haldol as needed. She is asleep. Today in the morning, denies any discomfort. Patient's family is interested in placing her, social worker's consulted for bed search, discussed with social worker today. Patient remains on IVC, has sitter 24/ 7. She denies and discomfort. Psychiatrist consultation is pending. Patient is receiving Haldol intermittently, per nursing staff, gets more upset when the patient's family is visiting, she does not know why she is left alone. She wants to go back home.  Review of Systems  Constitutional: Negative for chills, fever and weight loss.  HENT: Negative for congestion.   Eyes: Negative for blurred vision and double vision.  Respiratory: Negative for cough, sputum production, shortness of breath and wheezing.   Cardiovascular: Negative for chest pain, palpitations, orthopnea, leg swelling and PND.  Gastrointestinal: Negative for abdominal pain, blood in stool, constipation, diarrhea, nausea and vomiting.  Genitourinary: Negative for dysuria, frequency, hematuria and urgency.  Musculoskeletal: Negative for falls.  Neurological: Negative for dizziness, tremors, focal weakness and headaches.  Endo/Heme/Allergies: Does not bruise/bleed easily.   Psychiatric/Behavioral: Negative for depression. The patient does not have insomnia.     VITAL SIGNS: Blood pressure (!) 153/55, pulse 73, temperature 98.5 F (36.9 C), temperature source Oral, resp. rate 19, height 5\' 5"  (1.651 m), weight 60.1 kg (132 lb 6.4 oz), SpO2 100 %.  PHYSICAL EXAMINATION:   GENERAL:  83 y.o.-year-old patient lying in the bed with no acute distress.  EYES: Pupils equal, round, reactive to light and accommodation. No scleral icterus. Extraocular muscles intact.  HEENT: Head atraumatic, normocephalic. Oropharynx and nasopharynx clear.  NECK:  Supple, no jugular venous distention. No thyroid enlargement, no tenderness.  LUNGS: Normal breath sounds bilaterally, no wheezing, rales,rhonchi or crepitation. No use of accessory muscles of respiration.  CARDIOVASCULAR: S1, S2 normal. No murmurs, rubs, or gallops.  ABDOMEN: Soft, nontender, nondistended. Bowel sounds present. No organomegaly or mass.  EXTREMITIES: No pedal edema, cyanosis, or clubbing.  NEUROLOGIC: Cranial nerves II through XII are intact. Muscle strength 5/5 in all extremities. Sensation intact. Gait not checked.  PSYCHIATRIC: The patient is alert and oriented x 2.  SKIN: No obvious rash, lesion, or ulcer.   ORDERS/RESULTS REVIEWED:   CBC  Recent Labs Lab 07/27/16 1526 07/28/16 0556  WBC 8.7 7.0  HGB 13.0 11.3*  HCT 39.7 33.3*  PLT 215 179  MCV 101.1* 99.1  MCH 33.2 33.5  MCHC 32.8 33.9  RDW 12.9 12.9   ------------------------------------------------------------------------------------------------------------------  Chemistries   Recent Labs Lab 07/27/16 1526 07/28/16 0556  NA 143 142  K 4.1 3.5  CL 107 111  CO2 27 26  GLUCOSE 130* 89  BUN 19 12  CREATININE 1.19* 0.77  CALCIUM 9.1 8.7*  AST 34  --   ALT 18  --   ALKPHOS 86  --  BILITOT 1.0  --    ------------------------------------------------------------------------------------------------------------------ estimated  creatinine clearance is 37.9 mL/min (by C-G formula based on SCr of 0.77 mg/dL). ------------------------------------------------------------------------------------------------------------------ No results for input(s): TSH, T4TOTAL, T3FREE, THYROIDAB in the last 72 hours.  Invalid input(s): FREET3  Cardiac Enzymes  Recent Labs Lab 07/28/16 0009 07/28/16 0556 07/28/16 1233  TROPONINI 0.14* 0.13* 0.10*   ------------------------------------------------------------------------------------------------------------------ Invalid input(s): POCBNP ---------------------------------------------------------------------------------------------------------------  RADIOLOGY: No results found.  EKG:  Orders placed or performed during the hospital encounter of 07/27/16  . ED EKG  . ED EKG  . EKG 12-Lead  . EKG 12-Lead  . EKG 12-Lead    ASSESSMENT AND PLAN:  Active Problems:   Altered mental status  #1. Dementia with behavioral disturbance, psychiatric consultation is pending, Continue Haldol as needed, EKG was repeated, QT interval is about 450 ms, Patient remains on IVC, has sitter 24/7, discussed with care management today, appreciate physical therapist evaluation, skilled nursing facility was recommended #2. Acute renal insufficiency, resolved with IV fluid administration, oral intake is relatively good, now off IV fluids, urinalysis was unremarkable  #3. Elevated troponin, likely demand ischemia, echocardiogram revealed normal ejection fraction, diastolic dysfunction, moderate aortic and tricuspid regurgitation, mild mitral regurgitation, Advance lisinopril dose #4. Anemia, Hemoccult is spending  #5. Hyperglycemia, normal fasting blood glucose level, no diabetes #6. Generalized weakness, skilled nursing facility placement was recommended, social worker is involved #7. Essential hypertension, advance lisinopril, continue metoprolol.   Management plans discussed with the patient,  family and they are in agreement.   DRUG ALLERGIES:  Allergies  Allergen Reactions  . Dairy Aid [Lactase]     Intolerance.    CODE STATUS:     Code Status Orders        Start     Ordered   07/28/16 0000  Full code  Continuous     07/27/16 2359    Code Status History    Date Active Date Inactive Code Status Order ID Comments User Context   This patient has a current code status but no historical code status.    Advance Directive Documentation   Flowsheet Row Most Recent Value  Type of Advance Directive  Healthcare Power of Attorney  Pre-existing out of facility DNR order (yellow form or pink MOST form)  No data  "MOST" Form in Place?  No data      TOTAL TIME TAKING CARE OF THIS PATIENT: 20 minutes.    Katharina CaperVAICKUTE,Jonalyn Sedlak M.D on 07/31/2016 at 1:33 PM  Between 7am to 6pm - Pager - [phone]  After 6pm go to [email or web address] - password [email or web address] [email or web address]  [facility]  Office  [phone]  CC: Primary care physician; No PCP Per Patient

## 2016-07-31 NOTE — Progress Notes (Signed)
Clinical Child psychotherapistocial Worker (CSW) received a call back from [facility] coordinator at [facility] who stated that she would have to see the psych MD note before she could make a decision about a bed offer. Psych consult is still pending.   [employer] Hughes IncorporatedBailey Kayn Haymore, LCSW [phone]

## 2016-08-01 DIAGNOSIS — F0391 Unspecified dementia with behavioral disturbance: Secondary | ICD-10-CM | POA: Diagnosis not present

## 2016-08-01 MED ORDER — SENNOSIDES-DOCUSATE SODIUM 8.6-50 MG PO TABS: 1.0000 | ORAL_TABLET | Freq: Every evening | ORAL | 0 refills | Status: AC | PRN

## 2016-08-01 MED ORDER — ASPIRIN 81 MG PO TBEC: 81.0000 mg | DELAYED_RELEASE_TABLET | Freq: Every day | ORAL | 0 refills | Status: AC

## 2016-08-01 MED ORDER — LISINOPRIL 5 MG PO TABS: 5.0000 mg | ORAL_TABLET | Freq: Every day | ORAL | 0 refills | Status: AC

## 2016-08-01 MED ORDER — RISPERIDONE 0.5 MG PO TABS: 0.2500 mg | ORAL_TABLET | Freq: Three times a day (TID) | ORAL | Status: DC | PRN

## 2016-08-01 MED ORDER — METOPROLOL SUCCINATE ER 25 MG PO TB24: 12.5000 mg | ORAL_TABLET | Freq: Every day | ORAL | 0 refills | Status: AC

## 2016-08-01 NOTE — Discharge Summary (Signed)
[facility] -  at [facility]   PATIENT NAME: Allison Robinson    MR#:  [id number]  DATE OF BIRTH:  1919-01-10  DATE OF ADMISSION:  07/27/2016 ADMITTING PHYSICIAN: Tonye Royalty, DO  DATE OF DISCHARGE: 08/01/2016  PRIMARY CARE PHYSICIAN: No PCP Per Patient    ADMISSION DIAGNOSIS:  Suicidal ideation [R45.851] Altered mental status, unspecified altered mental status type [R41.82]  DISCHARGE DIAGNOSIS:  Principal Problem:   Moderate dementia with behavioral disturbance Active Problems:   Altered mental status   SECONDARY DIAGNOSIS:   Past Medical History:  Diagnosis Date  . Hypertension     HOSPITAL COURSE:   83 year old female with a history of hypertension and undiagnosed dementia who is admitted with altered mental status. For further details please refer the H&P.   1. Dementia with behavioral disturbance: Patient was evaluated by psychiatry. He did not recommend to start any medications as patient is now on a calm state. She was initially placed with IVC and sitter. This is been discontinued by the psychiatrist.  2. Elevated troponin due to demand ischemia. Echocardiogram showed normal ejection fraction without wall motion abnormalities. She does have moderate AR and TR  3. AK I: This is improved a fluids  4. Generalized weakness: Physical therapy has recommended skilled facility  5. Essential hypertension: Continue lisinopril and metoprolol  DISCHARGE CONDITIONS AND DIET:   Stable for discharge on regular diet  CONSULTS OBTAINED:  Treatment Team:  Audery Amel, MD  DRUG ALLERGIES:   Allergies  Allergen Reactions  . Dairy Aid [Lactase]     Intolerance.    DISCHARGE MEDICATIONS:   Current Discharge Medication List    START taking these medications   Details  aspirin EC 81 MG EC tablet Take 1 tablet (81 mg total) by mouth daily. Qty: 120 tablet, Refills: 0    lisinopril (PRINIVIL,ZESTRIL) 5 MG tablet Take 1 tablet (5 mg total)  by mouth daily. Qty: 30 tablet, Refills: 0    metoprolol succinate (TOPROL-XL) 25 MG 24 hr tablet Take 0.5 tablets (12.5 mg total) by mouth daily. Qty: 60 tablet, Refills: 0    senna-docusate (SENOKOT-S) 8.6-50 MG tablet Take 1 tablet by mouth at bedtime as needed for mild constipation. Qty: 30 tablet, Refills: 0              Today   CHIEF COMPLAINT:  No acute issues overnight. Patient is calm   VITAL SIGNS:  Blood pressure (!) 135/44, pulse 65, temperature 98.2 F (36.8 C), temperature source Oral, resp. rate 18, height 5\' 5"  (1.651 m), weight 60.1 kg (132 lb 6.4 oz), SpO2 100 %.   REVIEW OF SYSTEMS:  Review of Systems  Unable to perform ROS: Dementia     PHYSICAL EXAMINATION:  GENERAL:  83 y.o.-year-old patient lying in the bed with no acute distress.  NECK:  Supple, no jugular venous distention. No thyroid enlargement, no tenderness.  LUNGS: Normal breath sounds bilaterally, no wheezing, rales,rhonchi  No use of accessory muscles of respiration.  CARDIOVASCULAR: S1, S2 normal. No murmurs, rubs, or gallops.  ABDOMEN: Soft, non-tender, non-distended. Bowel sounds present. No organomegaly or mass.  EXTREMITIES: No pedal edema, cyanosis, or clubbing.  PSYCHIATRIC: The patient is alert and oriented x name and place not time.  SKIN: No obvious rash, lesion, or ulcer.   DATA REVIEW:   CBC  Recent Labs Lab 07/28/16 0556  WBC 7.0  HGB 11.3*  HCT 33.3*  PLT 179    Chemistries   Recent Labs  Lab 07/27/16 1526 07/28/16 0556  NA 143 142  K 4.1 3.5  CL 107 111  CO2 27 26  GLUCOSE 130* 89  BUN 19 12  CREATININE 1.19* 0.77  CALCIUM 9.1 8.7*  AST 34  --   ALT 18  --   ALKPHOS 86  --   BILITOT 1.0  --     Cardiac Enzymes  Recent Labs Lab 07/28/16 0009 07/28/16 0556 07/28/16 1233  TROPONINI 0.14* 0.13* 0.10*    Microbiology Results  @[email or web address] @  RADIOLOGY:  No results found.    Management plans discussed with the patient's daughter and  she is in agreement. Stable for discharge SNF  Patient should follow up with pcp at facility  CODE STATUS:     Code Status Orders        Start     Ordered   07/28/16 0000  Full code  Continuous     07/27/16 2359    Code Status History    Date Active Date Inactive Code Status Order ID Comments User Context   This patient has a current code status but no historical code status.    Advance Directive Documentation   Flowsheet Row Most Recent Value  Type of Advance Directive  Healthcare Power of Attorney  Pre-existing out of facility DNR order (yellow form or pink MOST form)  No data  "MOST" Form in Place?  No data      TOTAL TIME TAKING CARE OF THIS PATIENT: 36 minutes.    Note: This dictation was prepared with Dragon dictation along with smaller phrase technology. Any transcriptional errors that result from this process are unintentional.  Allison Robinson M.D on 08/01/2016 at 9:43 AM  Between 7am to 6pm - Pager - [phone] After 6pm go to [email or web address] - Social research officer, [email or web address] [email or web address]  [facility]  Office  [phone]  CC: Primary care physician; No PCP Per Patient

## 2016-08-01 NOTE — Progress Notes (Signed)
Per [facility] admissions coordinator at [facility] they don't have a bed today. Per [facility] admissions coordinator at [facility] they will send their social worker out to assess patient today at 3 pm to see if they can make a bed offer. Per Jackelyn Poling even if they can make a bed offer they would not be able to take patient until tomorrow.   Clinical Education officer, museum (CSW) met with patient's daughter Bonnita Nasuti and made her aware of above. CSW explained to daughter that patient is medically stable for D/C today and the only bed offer is from [facility]. Daughter accepted bed at [facility] and will work in getting patient moved from [place] to [place]. Per [facility] admissions coordinator at [facility] patient can come today to room 39. Marden Noble is aware that daughter is working on transfer to [facility]. CSW sent D/C orders to [facility] via [place]. RN will call report and arrange EMS. Please reconsult if future social work needs arise. CSW signing off.   [employer], LCSW [phone]

## 2016-08-01 NOTE — Progress Notes (Signed)
[facility]

## 2016-08-01 NOTE — Progress Notes (Signed)
Called report to McClaveKeisha, LPN at [facility], answered all questions. Called EMS for transport. VSS at this time.

## 2016-08-01 NOTE — Clinical Social Work Placement (Signed)
   CLINICAL SOCIAL WORK PLACEMENT  NOTE  Date:  08/01/2016  Patient Details  Name: Allison Robinson Mae Jhaveri MRN: [id number] Date of Birth: [birth year]  Clinical Social Work is seeking post-discharge placement for this patient at the Skilled  Nursing Facility level of care (*CSW will initial, date and re-position this form in  chart as items are completed):  Yes   Patient/family provided with [facility] Clinical Social Work Department's list of facilities offering this level of care within the geographic area requested by the patient (or if unable, by the patient's family).  Yes   Patient/family informed of their freedom to choose among providers that offer the needed level of care, that participate in Medicare, Medicaid or managed care program needed by the patient, have an available bed and are willing to accept the patient.  Yes   Patient/family informed of 's ownership interest in [facility] and [facility], as well as of the fact that they are under no obligation to receive care at these facilities.  PASRR submitted to EDS on 07/29/16     PASRR number received on 07/29/16     Existing PASRR number confirmed on       FL2 transmitted to all facilities in geographic area requested by pt/family on 07/29/16     FL2 transmitted to all facilities within larger geographic area on 07/30/16     Patient informed that his/her managed care company has contracts with or will negotiate with certain facilities, including the following:        Yes   Patient/family informed of bed offers received.  Patient chooses bed at  [facility] )     Physician recommends and patient chooses bed at      Patient to be transferred to  [facility] ) on 08/01/16.  Patient to be transferred to facility by  [facility] )     Patient family notified on 08/01/16 of transfer.  Name of family member notified:   (Patient's daughter Myriam JacobsonHelen is aware of D/C today. )      PHYSICIAN       Additional Comment:    _______________________________________________ Joelynn Dust, Darleen CrockerBailey M, LCSW 08/01/2016, 1:39 PM

## 2016-08-01 NOTE — Consult Note (Signed)
[facility] Face-to-Face Psychiatry Consult   Reason for Consult:  Consult for this 83 year old woman brought into the hospital by her family with altered mental status Referring Physician:  Winona Legato Patient Identification: Allison Robinson MRN:  [id number] Principal Diagnosis: Moderate dementia with behavioral disturbance Diagnosis:   Patient Active Problem List   Diagnosis Date Noted  . Moderate dementia with behavioral disturbance [F03.91] 07/31/2016  . Altered mental status [R41.82] 07/27/2016    Total Time spent with patient: 15 minutes  Subjective:   Allison Robinson is a 83 y.o. female patient admitted with "I don't know".  Follow-up on Wednesday the 10th. Patient seen. Spoke with hospitalist. 83 year old woman with dementia. She has no complaints today. Apparently still is occasionally verbally agitated especially when family are around but has not been aggressive. Physically stable. Affect euthymic. Denies any suicidal thoughts.  HPI:  This is a 83 year old woman was brought to the hospital over the weekend with altered mental status. It was reported that family had gone to visit her and found her outside wandering around in the snow and appearing to be delirious. Also they reported that she had made statements about shooting herself and actually did have a gun at home. On interview today the patient is awake and alert and cooperative. She has no specific complaint of her own. Did not know where she was at first although when I pointed out some clues to her she was able to figure out that it was a hospital room. She had no memory of being brought into the hospital. Patient denied being depressed. Denied being aware of sleep or appetite problems. Interestingly, when I talk with her about her suicidal statement she readily admitted having made statements about killing herself. She told me she thinks that everyone says that now and then. She denied having any actual wish to die or hurt herself. She  told me that she would rather just let the Lord take his way. Patient denied having any hallucinations. She was not acting delirious and did not appear to be hallucinating. Rarely demented however.  Social history: Evidently had been living independently until just the last couple days. It sounds like she has a very supportive extended family.  Medical history: Patient has high blood pressure but otherwise sounds like she doesn't get much medical treatment and hasn't had a lot of documented medical care. Her [place] substance abuse history: Denies ever having had an alcohol problem in the past or drinking currently.  Past Psychiatric History: No known past psychiatric history. No history of suicide attempts no history of hospitalization no known history of psychiatric medication.  Risk to Self: Suicidal Ideation: No Suicidal Intent: No Is patient at risk for suicide?: No Suicidal Plan?: No Access to Means: No What has been your use of drugs/alcohol within the last 12 months?: None reported How many times?: 0 Other Self Harm Risks: altered mental status Triggers for Past Attempts: None known Intentional Self Injurious Behavior: None Risk to Others: Homicidal Ideation: No Thoughts of Harm to Others: No Current Homicidal Intent: No Current Homicidal Plan: No Access to Homicidal Means: No Identified Victim: none identified History of harm to others?: No Assessment of Violence: None Noted Violent Behavior Description: None identified Does patient have access to weapons?: No Criminal Charges Pending?: No Does patient have a court date: No Prior Inpatient Therapy: Prior Inpatient Therapy: No Prior Therapy Dates: na Prior Therapy Facilty/Provider(s): na Reason for Treatment: na Prior Outpatient Therapy: Prior Outpatient Therapy: No Prior Therapy Dates:  na Prior Therapy Facilty/Provider(s): na Reason for Treatment: na Does patient have an ACCT team?: No Does patient have Intensive  In-House Services?  : No Does patient have [facility] services? : No Does patient have P4CC services?: No  Past Medical History:  Past Medical History:  Diagnosis Date  . Hypertension    History reviewed. No pertinent surgical history. Family History: No family history on file. Family Psychiatric  History: No known family history Social History:  History  Alcohol Use No     History  Drug use: Unknown    Social History   Social History  . Marital status: Widowed    Spouse name: N/A  . Number of children: N/A  . Years of education: N/A   Social History Main Topics  . Smoking status: Never Smoker  . Smokeless tobacco: Current User    Types: Snuff  . Alcohol use No  . Drug use: Unknown  . Sexual activity: Not Asked   Other Topics Concern  . None   Social History Narrative  . None   Additional Social History:    Allergies:   Allergies  Allergen Reactions  . Dairy Aid [Lactase]     Intolerance.    Labs: No results found for this or any previous visit (from the past 48 hour(s)).  Current Facility-Administered Medications  Medication Dose Route Frequency Provider Last Rate Last Dose  . acetaminophen (TYLENOL) tablet 650 mg  650 mg Oral Q6H PRN Alexis Hugelmeyer, DO       Or  . acetaminophen (TYLENOL) suppository 650 mg  650 mg Rectal Q6H PRN Alexis Hugelmeyer, DO      . aspirin EC tablet 81 mg  81 mg Oral Daily Alexis Hugelmeyer, DO   81 mg at 08/01/16 0933  . bisacodyl (DULCOLAX) EC tablet 5 mg  5 mg Oral Daily PRN Alexis Hugelmeyer, DO      . enoxaparin (LOVENOX) injection 40 mg  40 mg Subcutaneous QHS Katharina Caper, MD   40 mg at 07/31/16 2115  . haloperidol lactate (HALDOL) injection 1 mg  1 mg Intravenous Q6H PRN Katharina Caper, MD   1 mg at 07/30/16 1417  . lisinopril (PRINIVIL,ZESTRIL) tablet 5 mg  5 mg Oral Daily Katharina Caper, MD   5 mg at 08/01/16 0933  . magnesium citrate solution 1 Bottle  1 Bottle Oral Once PRN Alexis Hugelmeyer, DO      . metoprolol  succinate (TOPROL-XL) 24 hr tablet 12.5 mg  12.5 mg Oral Daily Alexis Hugelmeyer, DO   12.5 mg at 08/01/16 0933  . ondansetron (ZOFRAN) tablet 4 mg  4 mg Oral Q6H PRN Alexis Hugelmeyer, DO       Or  . ondansetron (ZOFRAN) injection 4 mg  4 mg Intravenous Q6H PRN Alexis Hugelmeyer, DO      . oxyCODONE (Oxy IR/ROXICODONE) immediate release tablet 5 mg  5 mg Oral Q4H PRN Alexis Hugelmeyer, DO      . risperiDONE (RISPERDAL M-TABS) disintegrating tablet 0.25 mg  0.25 mg Oral Q8H PRN Audery Amel, MD      . senna-docusate (Senokot-S) tablet 1 tablet  1 tablet Oral QHS PRN Alexis Hugelmeyer, DO      . sodium chloride flush (NS) 0.9 % injection 3 mL  3 mL Intravenous Q12H Alexis Hugelmeyer, DO   3 mL at 07/31/16 2115    Musculoskeletal: Strength & Muscle Tone: decreased Gait & Station: unable to stand Patient leans: N/A  Psychiatric Specialty Exam: Physical Exam  Nursing note  and vitals reviewed. Constitutional: She appears well-developed.  HENT:  Head: Normocephalic and atraumatic.  Eyes: Conjunctivae are normal. Pupils are equal, round, and reactive to light.  Neck: Normal range of motion.  Cardiovascular: Regular rhythm and normal heart sounds.   Respiratory: Effort normal. No respiratory distress.  GI: Soft.  Musculoskeletal: Normal range of motion.  Neurological: She is alert.  Skin: Skin is warm and dry.  Psychiatric: Her affect is blunt. Her speech is delayed. She is slowed. Thought content is not paranoid. Cognition and memory are impaired. She expresses no homicidal and no suicidal ideation. She exhibits abnormal recent memory and abnormal remote memory. She is inattentive.    Review of Systems  Constitutional: Negative.   HENT: Negative.   Eyes: Negative.   Respiratory: Negative.   Cardiovascular: Negative.   Gastrointestinal: Negative.   Musculoskeletal: Negative.   Skin: Negative.   Neurological: Negative.   Psychiatric/Behavioral: Positive for memory loss. Negative for  depression, hallucinations, substance abuse and suicidal ideas. The patient is not nervous/anxious and does not have insomnia.     Blood pressure (!) 135/44, pulse 65, temperature 98.2 F (36.8 C), temperature source Oral, resp. rate 18, height 5\' 5"  (1.651 m), weight 60.1 kg (132 lb 6.4 oz), SpO2 100 %.Body mass index is 22.03 kg/m.  General Appearance: Casual  Eye Contact:  Fair  Speech:  Slow  Volume:  Decreased  Mood:  Euthymic  Affect:  Constricted  Thought Process:  Disorganized and Irrelevant  Orientation:  Negative  Thought Content:  Tangential  Suicidal Thoughts:  Yes.  without intent/plan  Homicidal Thoughts:  No  Memory:  Immediate;   Fair Recent;   Poor Remote;   Poor  Judgement:  Impaired  Insight:  Shallow  Psychomotor Activity:  Decreased  Concentration:  Concentration: Poor  Recall:  Poor  Fund of Knowledge:  Fair  Language:  Fair  Akathisia:  No  Handed:  Right  AIMS (if indicated):     Assets:  Housing Social Support  ADL's:  Impaired  Cognition:  Impaired,  Mild and Moderate  Sleep:        Treatment Plan Summary: Plan I have added an order for Risperdal 0.25 mg every 8 hours only as needed for agitation to have something in K she is more agitated at discharge. No other change to medication. Patient is being discharged today and will probably not need specific psychiatric follow-up in less the situation worsens.  Disposition: Patient does not meet criteria for psychiatric inpatient admission. Supportive therapy provided about ongoing stressors.  Mordecai RasmussenJohn Jartavious Mckimmy, MD 08/01/2016 3:37 PM

## 2018-05-03 IMAGING — CT CT HEAD W/O CM
4 of 5 series · 15 of 47 positions shown, 17 images · non-contrast
Comparison: None available.

CLINICAL DATA: Initial valuation for hallucination.

EXAM:
CT HEAD WITHOUT CONTRAST
TECHNIQUE: Contiguous axial images were obtained from the base of the skull
through the vertex without intravenous contrast.

[Series 3: head wo · axial · 0.45mm/px · z∈[-113,-3]mm · 8 of 30 slices shown, 10 images]
[im 4/30  brain]
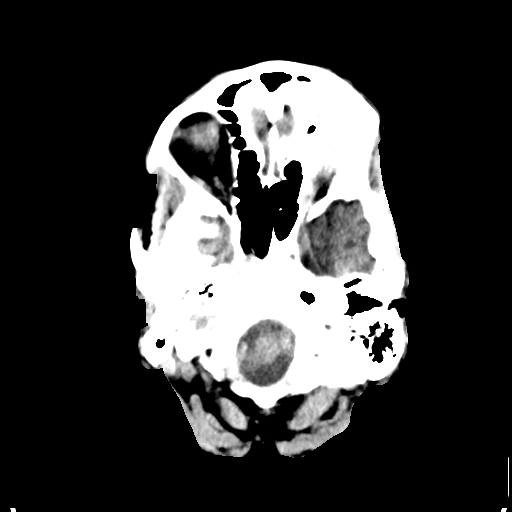
[im 4/30  bone]
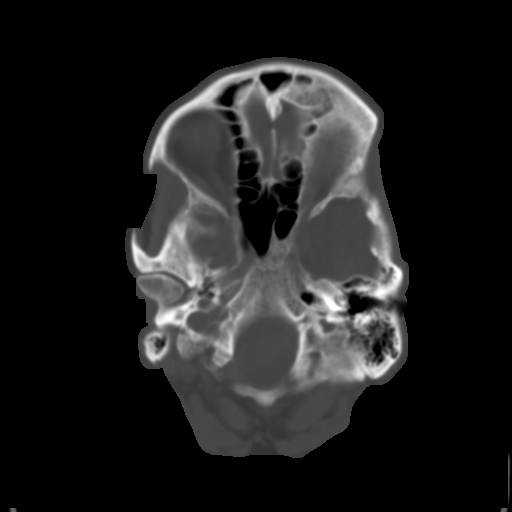
[im 7/30  brain]
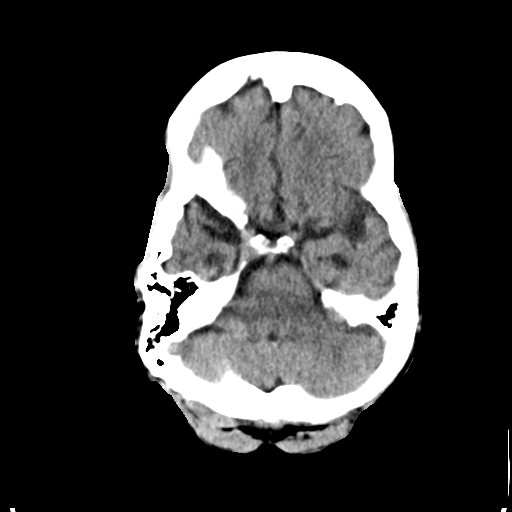
[im 10/30  brain]
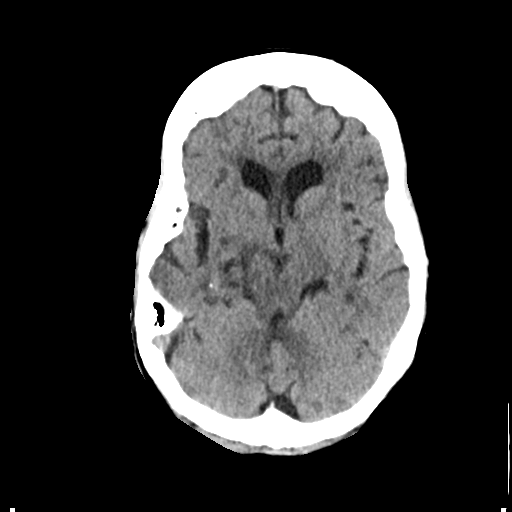
[im 13/30  brain]
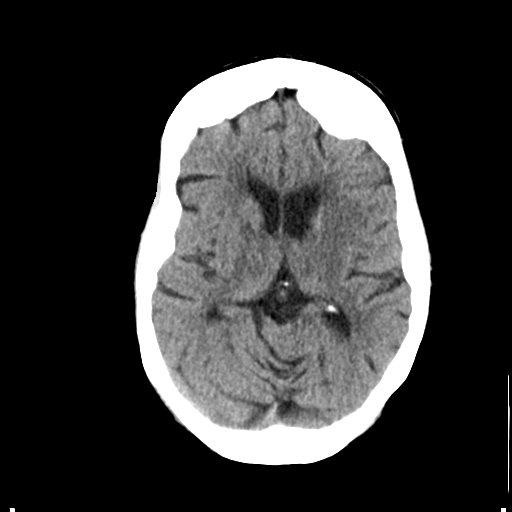
[im 17/30  brain]
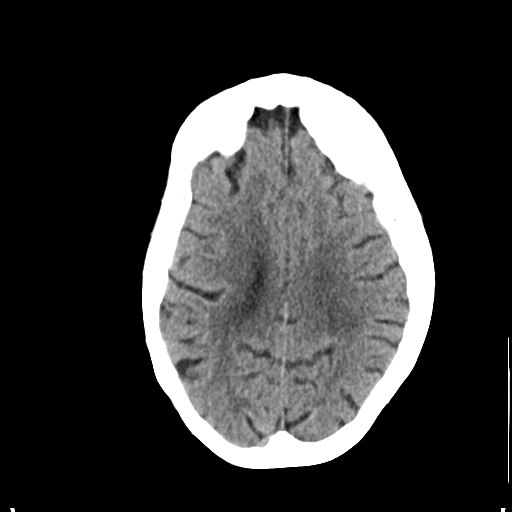
[im 17/30  bone]
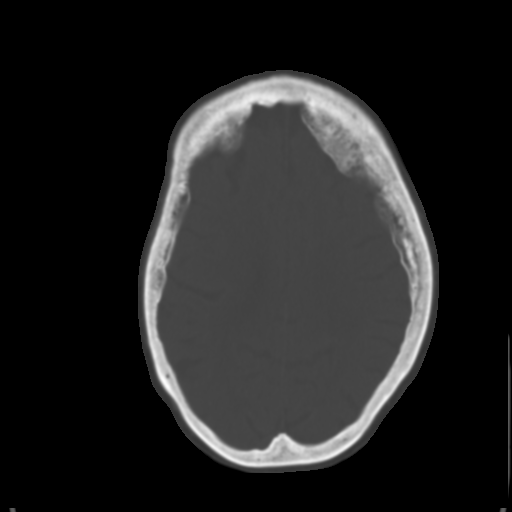
[im 20/30  brain]
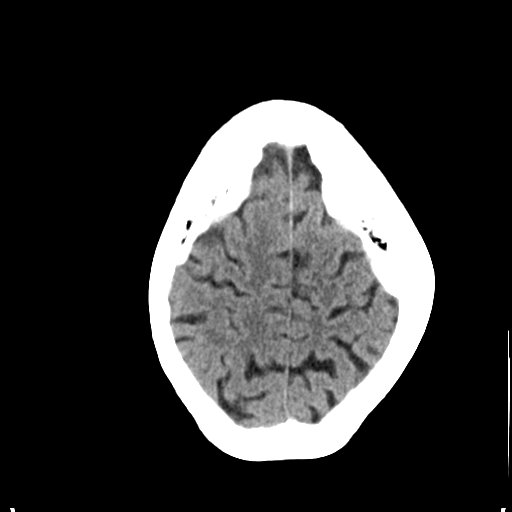
[im 23/30  brain]
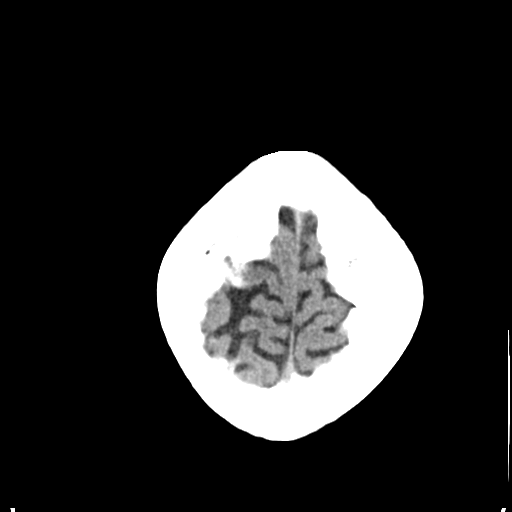
[im 26/30  brain]
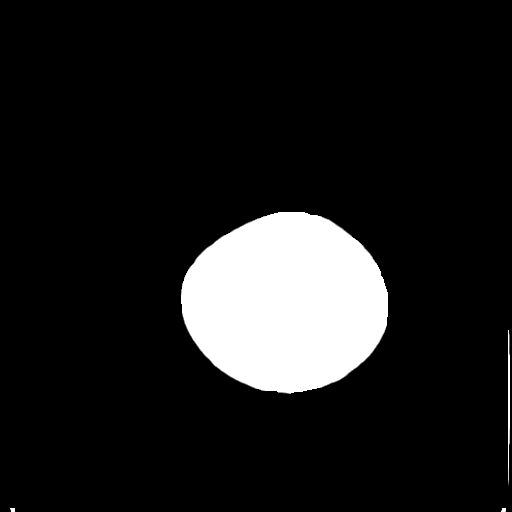

[Series 5: coronal soft tissue · coronal · 0.30mm/px · 3 of 64 slices shown]
[im 22/64  brain]
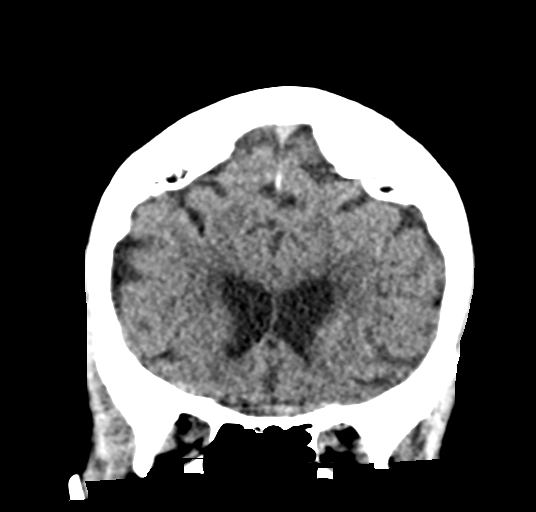
[im 29/64  brain]
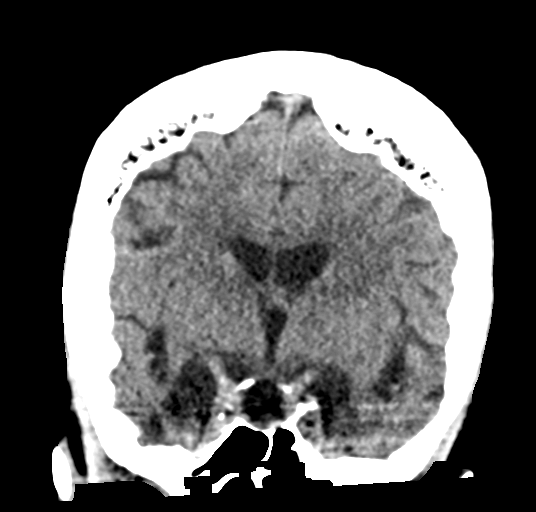
[im 36/64  brain]
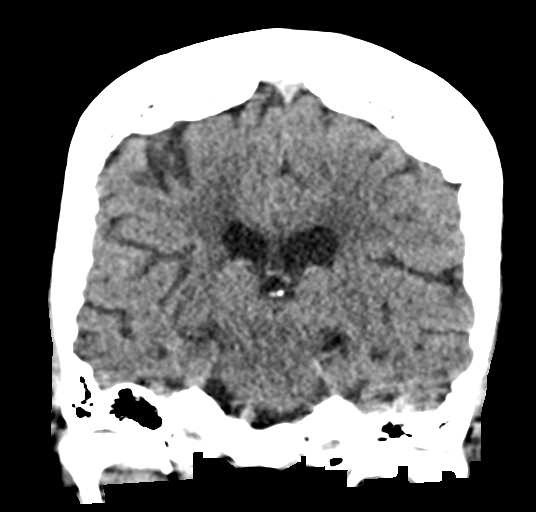

[Series 6: sagittal soft tissue · sagittal · 0.29mm/px · 3 of 47 slices shown]
[im 16/47  brain]
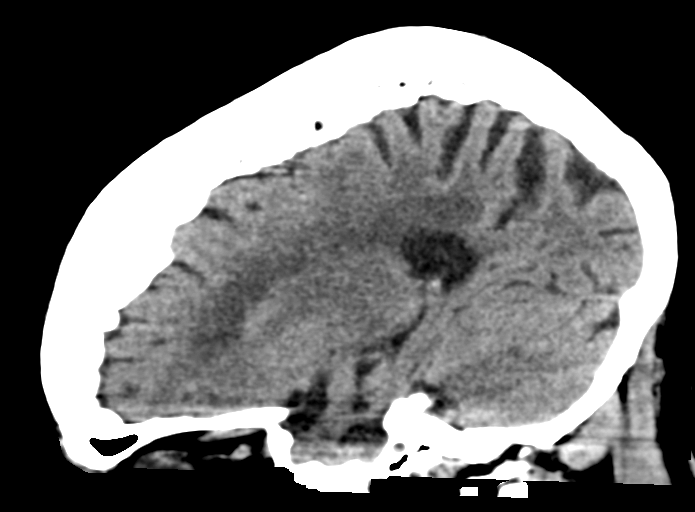
[im 24/47  brain]
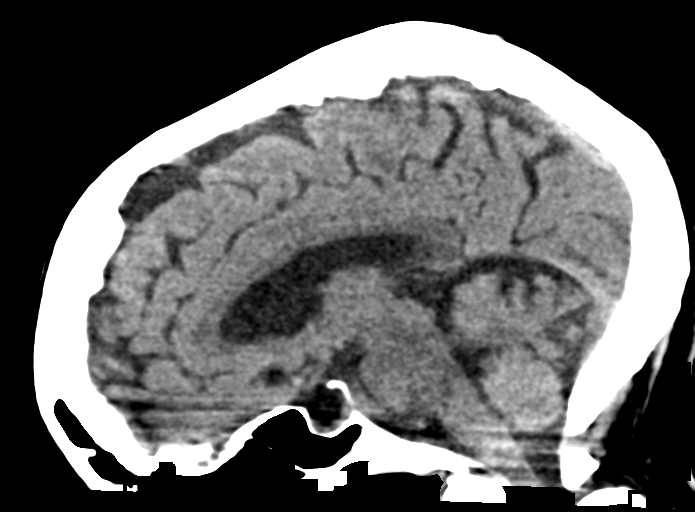
[im 31/47  brain]
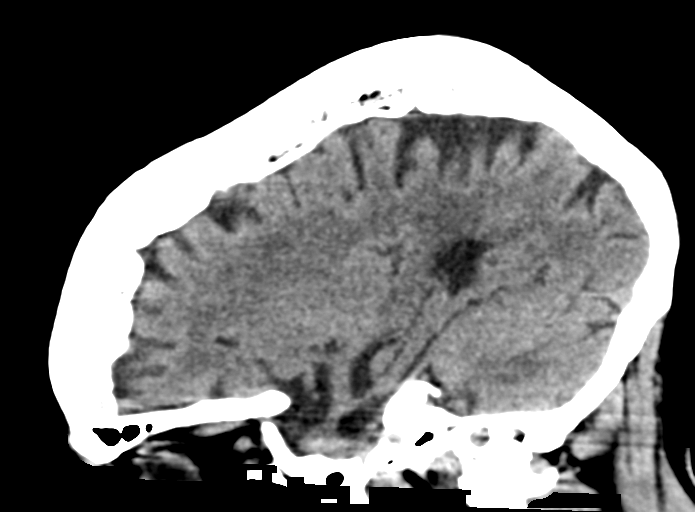

[Series 8: ax head bone recon · axial · 0.32mm/px · 1 of 27 slices shown]
[im 3/27  bone]
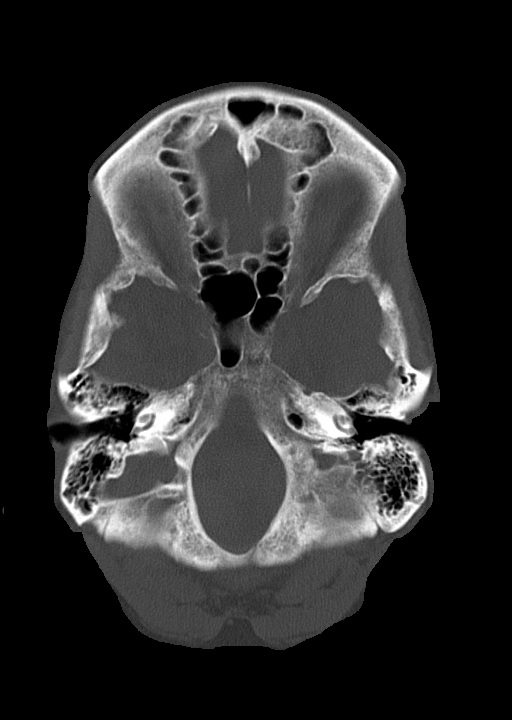

[15 of 47 positions shown; findings below may reference images not displayed]

FINDINGS: Brain: Generalized age-related cerebral atrophy with mild chronic
small vessel ischemic disease. No acute intracranial hemorrhage. No
evidence for acute large vessel territory infarct. No mass lesion,
midline shift or mass effect. No hydrocephalus. No extra-axial fluid
collection.

Vascular: No hyperdense vessel. Scattered intracranial
atherosclerosis noted.

Skull: Scalp soft tissues demonstrate no acute abnormality.
Hyperostosis frontalis interna. Calvarium intact.

Sinuses/Orbits: Visualized globes and orbits within normal limits.
Visualized paranasal sinuses and mastoids are clear.
IMPRESSION: 1. No acute intracranial process.
2. Generalized age-related cerebral atrophy with mild chronic small
vessel ischemic disease.

## 2022-11-21 DEATH — deceased
